# Patient Record
Sex: Female | Born: 1980 | Hispanic: Yes | Marital: Married | Smoking: Never smoker
Health system: Southern US, Community
[De-identification: ages and names within clinical notes are randomized; demographics above are authoritative.]

## PROBLEM LIST (undated history)

## (undated) DIAGNOSIS — K2970 Gastritis, unspecified, without bleeding: Secondary | ICD-10-CM

## (undated) DIAGNOSIS — J45909 Unspecified asthma, uncomplicated: Secondary | ICD-10-CM

## (undated) DIAGNOSIS — G43909 Migraine, unspecified, not intractable, without status migrainosus: Secondary | ICD-10-CM

## (undated) HISTORY — PX: LAPAROSCOPIC GASTRIC SLEEVE RESECTION: SHX5895

## (undated) HISTORY — PX: VAGINAL HYSTERECTOMY: SUR661

## (undated) HISTORY — PX: TUBAL LIGATION: SHX77

## (undated) HISTORY — PX: BREAST SURGERY: SHX581

---

## 2015-03-19 ENCOUNTER — Encounter (HOSPITAL_COMMUNITY): Payer: Self-pay | Admitting: *Deleted

## 2015-03-19 ENCOUNTER — Emergency Department (HOSPITAL_COMMUNITY)
Admission: EM | Admit: 2015-03-19 | Discharge: 2015-03-19 | Disposition: A | Discharge status: Home / Self Care | Payer: Self-pay | Attending: Emergency Medicine | Admitting: Emergency Medicine

## 2015-03-19 ENCOUNTER — Emergency Department (HOSPITAL_COMMUNITY): Payer: Self-pay

## 2015-03-19 DIAGNOSIS — M545 Low back pain: Secondary | ICD-10-CM | POA: Insufficient documentation

## 2015-03-19 DIAGNOSIS — R102 Pelvic and perineal pain: Secondary | ICD-10-CM | POA: Insufficient documentation

## 2015-03-19 DIAGNOSIS — R300 Dysuria: Secondary | ICD-10-CM | POA: Insufficient documentation

## 2015-03-19 DIAGNOSIS — R10819 Abdominal tenderness, unspecified site: Secondary | ICD-10-CM

## 2015-03-19 DIAGNOSIS — R1030 Lower abdominal pain, unspecified: Secondary | ICD-10-CM | POA: Insufficient documentation

## 2015-03-19 DIAGNOSIS — R110 Nausea: Secondary | ICD-10-CM | POA: Insufficient documentation

## 2015-03-19 LAB — URINE MICROSCOPIC-ADD ON

## 2015-03-19 LAB — CBC WITH DIFFERENTIAL/PLATELET
Basophils Absolute: 0 10*3/uL (ref 0.0–0.1)
Basophils Relative: 0 %
EOS PCT: 1 %
Eosinophils Absolute: 0.1 10*3/uL (ref 0.0–0.7)
HEMATOCRIT: 41.7 % (ref 36.0–46.0)
Hemoglobin: 13.5 g/dL (ref 12.0–15.0)
LYMPHS ABS: 0.7 10*3/uL (ref 0.7–4.0)
LYMPHS PCT: 10 %
MCH: 29.9 pg (ref 26.0–34.0)
MCHC: 32.4 g/dL (ref 30.0–36.0)
MCV: 92.5 fL (ref 78.0–100.0)
Monocytes Absolute: 0.3 10*3/uL (ref 0.1–1.0)
Monocytes Relative: 3 %
Neutro Abs: 6.3 10*3/uL (ref 1.7–7.7)
Neutrophils Relative %: 86 %
PLATELETS: 220 10*3/uL (ref 150–400)
RBC: 4.51 MIL/uL (ref 3.87–5.11)
RDW: 13.3 % (ref 11.5–15.5)
WBC: 7.4 10*3/uL (ref 4.0–10.5)

## 2015-03-19 LAB — URINALYSIS, ROUTINE W REFLEX MICROSCOPIC
BILIRUBIN URINE: NEGATIVE
Glucose, UA: NEGATIVE mg/dL
KETONES UR: NEGATIVE mg/dL
Leukocytes, UA: NEGATIVE
NITRITE: NEGATIVE
Protein, ur: NEGATIVE mg/dL
SPECIFIC GRAVITY, URINE: 1.022 (ref 1.005–1.030)
pH: 5.5 (ref 5.0–8.0)

## 2015-03-19 LAB — COMPREHENSIVE METABOLIC PANEL
ALK PHOS: 59 U/L (ref 38–126)
ALT: 19 U/L (ref 14–54)
AST: 17 U/L (ref 15–41)
Albumin: 4.3 g/dL (ref 3.5–5.0)
Anion gap: 9 (ref 5–15)
BILIRUBIN TOTAL: 0.9 mg/dL (ref 0.3–1.2)
BUN: 17 mg/dL (ref 6–20)
CALCIUM: 9.2 mg/dL (ref 8.9–10.3)
CHLORIDE: 105 mmol/L (ref 101–111)
CO2: 24 mmol/L (ref 22–32)
CREATININE: 0.73 mg/dL (ref 0.44–1.00)
Glucose, Bld: 95 mg/dL (ref 65–99)
Potassium: 4.1 mmol/L (ref 3.5–5.1)
Sodium: 138 mmol/L (ref 135–145)
TOTAL PROTEIN: 7.9 g/dL (ref 6.5–8.1)

## 2015-03-19 LAB — I-STAT BETA HCG BLOOD, ED (MC, WL, AP ONLY)

## 2015-03-19 LAB — WET PREP, GENITAL
CLUE CELLS WET PREP: NONE SEEN
Sperm: NONE SEEN
TRICH WET PREP: NONE SEEN
Yeast Wet Prep HPF POC: NONE SEEN

## 2015-03-19 LAB — LIPASE, BLOOD: LIPASE: 23 U/L (ref 11–51)

## 2015-03-19 MED ORDER — KETOROLAC TROMETHAMINE 30 MG/ML IJ SOLN
30.0000 mg | Freq: Once | INTRAMUSCULAR | Status: AC
  Administered 2015-03-19: 30 mg via INTRAVENOUS
  Filled 2015-03-19: qty 1

## 2015-03-19 MED ORDER — KETOROLAC TROMETHAMINE 30 MG/ML IJ SOLN: 30.0000 mg | Freq: Once | INTRAMUSCULAR | Status: DC

## 2015-03-19 MED ORDER — IBUPROFEN 600 MG PO TABS: 600.0000 mg | ORAL_TABLET | Freq: Four times a day (QID) | ORAL | Status: DC | PRN

## 2015-03-19 MED ORDER — ONDANSETRON HCL 4 MG/2ML IJ SOLN
4.0000 mg | Freq: Once | INTRAMUSCULAR | Status: AC
  Administered 2015-03-19: 4 mg via INTRAVENOUS
  Filled 2015-03-19: qty 2

## 2015-03-19 NOTE — ED Notes (Signed)
Bed: WA05 Expected date:  Expected time:  Means of arrival:  Comments: 

## 2015-03-19 NOTE — ED Notes (Signed)
Abdominal pain started this morning. Diarrhea x4, no vomiting. Also c/o back pain

## 2015-03-19 NOTE — ED Notes (Signed)
Pt cannot use restroom at this time, aware urine specimen is needed.  

## 2015-03-19 NOTE — Discharge Instructions (Signed)
We did not find serious cause of your symptoms today. Return without fail for worsening symptoms, including worsening pain, fever, vomiting and unable to keep down food/fluids or any other symptoms concerning to you.  Dolor abdominal en adultos (Abdominal Pain, Adult) El dolor puede tener muchas causas. Normalmente la causa del dolor abdominal no es una enfermedad y Scientist, clinical (histocompatibility and immunogenetics)mejorar sin TEFL teachertratamiento. Frecuentemente puede controlarse y tratarse en casa. Su mdico le Medical sales representativerealizar un examen fsico y posiblemente solicite anlisis de sangre y radiografas para ayudar a Chief Strategy Officerdeterminar la gravedad de su dolor. Sin embargo, en IAC/InterActiveCorpmuchos casos, debe transcurrir ms tiempo antes de que se pueda Clinical research associateencontrar una causa evidente del dolor. Antes de llegar a ese punto, es posible que su mdico no sepa si necesita ms pruebas o un tratamiento ms profundo. INSTRUCCIONES PARA EL CUIDADO EN EL HOGAR  Est atento al dolor para ver si hay cambios. Las siguientes indicaciones ayudarn a Architectural technologistaliviar cualquier molestia que pueda sentir:  [place] solo medicamentos de venta libre o recetados, segn las indicaciones del mdico.  No tome laxantes a menos que se lo haya indicado su mdico.  Pruebe con Neomia Dearuna dieta lquida absoluta (caldo, t o agua) segn se lo indique su mdico. Introduzca gradualmente una dieta normal, segn su tolerancia. SOLICITE ATENCIN MDICA SI:  Tiene dolor abdominal sin explicacin.  Tiene dolor abdominal relacionado con nuseas o diarrea.  Tiene dolor cuando orina o defeca.  Experimenta dolor abdominal que lo despierta de noche.  Tiene dolor abdominal que empeora o mejora cuando come alimentos.  Tiene dolor abdominal que empeora cuando come alimentos grasosos.  Tiene fiebre. SOLICITE ATENCIN MDICA DE INMEDIATO SI:   El dolor no desaparece en un plazo mximo de 2horas.  No deja de (vomitar).  El Engineer, miningdolor se siente solo en partes del abdomen, como el lado derecho o la parte inferior izquierda del  abdomen.  Evaca materia fecal sanguinolenta o negra, de aspecto alquitranado. ASEGRESE DE QUE:  Comprende estas instrucciones.  Controlar su afeccin.  Recibir ayuda de inmediato si no mejora o si empeora.   Esta informacin no tiene Theme park managercomo fin reemplazar el consejo del mdico. Asegrese de hacerle al mdico cualquier pregunta que tenga.   Document Released: 02/27/2005 Document Revised: 03/20/2014 Elsevier Interactive Patient Education [employer]2016 Elsevier Inc.

## 2015-03-19 NOTE — ED Provider Notes (Signed)
CSN: [id number]     Arrival date & time 03/19/15  [id number] History   First MD Initiated Contact with Patient 03/19/15 [phone]     Chief Complaint  Patient presents with  . Abdominal Pain  . Back Pain     (Consider location/radiation/quality/duration/timing/severity/associated sxs/prior Treatment) HPI 35 year old female, otherwise healthy, who presents with low abdominal pain and low back pain for one day. Went to bed well last night feeling well and this morning woke up with low abdominal cramping and low back pain. Pain 6/10 in severity and unremitting. Nausea but no vomiting. States 4 small bowel movements that were soft, but denies diarrhea. No fever or chills. No vaginal bleeding or discharge. Notes possible mild dysuria but no frequency or hematuria. No known sick contacts. No cough, congestion, sore throat, runny nose.    History reviewed. No pertinent past medical history. History reviewed. No pertinent past surgical history. History reviewed. No pertinent family history. Social History  Substance Use Topics  . Smoking status: Never Smoker   . Smokeless tobacco: None  . Alcohol Use: No   OB History    No data available     Review of Systems 10/14 systems reviewed and are negative other than those stated in the HPI   Allergies  Ancef  Home Medications   Prior to Admission medications   Medication Sig Start Date End Date Taking? Authorizing Provider  ibuprofen (ADVIL,MOTRIN) 600 MG tablet Take 1 tablet (600 mg total) by mouth every 6 (six) hours as needed. 03/19/15   Lavera Guiseana Duo Pheng Prokop, MD   BP 121/77 mmHg  Pulse 107  Temp(Src) 98.4 F (36.9 C) (Oral)  Resp 18  SpO2 99%  LMP 02/04/2015 Physical Exam Physical Exam  Nursing note and vitals reviewed. Constitutional: Well developed, well nourished, non-toxic, and in no acute distress Head: Normocephalic and atraumatic.  Mouth/Throat: Oropharynx is clear and moist.  Neck: Normal range of motion. Neck supple.  Cardiovascular:  Normal rate and regular rhythm.   Pulmonary/Chest: Effort normal and breath sounds normal.  Abdominal: Soft. There is suprapubic and bilateral adnexal tenderness. No tenderness at McBurney's point. There is no rebound and no guarding. No CVA tenderness.  Pelvic: Normal external genitalia. Normal internal genitalia. White watery discharge. No blood within the vagina. Minimal cervical motion tenderness. No adnexal masses or tenderness. Musculoskeletal: Normal range of motion. low paraspinal neck tenderness Neurological: Alert, no facial droop, fluent speech, moves all extremities symmetrically Skin: Skin is warm and dry.  Psychiatric: Cooperative  ED Course  Procedures (including critical care time) Labs Review Labs Reviewed  WET PREP, GENITAL - Abnormal; Notable for the following:    WBC, Wet Prep HPF POC MANY (*)    All other components within normal limits  URINALYSIS, ROUTINE W REFLEX MICROSCOPIC (NOT AT [facility]) - Abnormal; Notable for the following:    Hgb urine dipstick TRACE (*)    All other components within normal limits  URINE MICROSCOPIC-ADD ON - Abnormal; Notable for the following:    Squamous Epithelial / LPF 0-5 (*)    Bacteria, UA RARE (*)    All other components within normal limits  CBC WITH DIFFERENTIAL/PLATELET  COMPREHENSIVE METABOLIC PANEL  LIPASE, BLOOD  I-STAT BETA HCG BLOOD, ED ([facility], [facility], [facility] ONLY)  I-STAT BETA HCG BLOOD, ED ([facility], [facility], [facility] ONLY)  GC/CHLAMYDIA PROBE AMP ([place]) NOT AT [facility]    Imaging Review Kore[place] Transvaginal Non-ob  03/19/2015  CLINICAL DATA:  Patient with bilateral pelvic pain. EXAM: TRANSABDOMINAL AND  TRANSVAGINAL ULTRASOUND OF PELVIS DOPPLER ULTRASOUND OF OVARIES TECHNIQUE: Both transabdominal and transvaginal ultrasound examinations of the pelvis were performed. Transabdominal technique was performed for global imaging of the pelvis including uterus, ovaries, adnexal regions, and pelvic cul-de-sac. It was necessary to proceed with endovaginal exam  following the transabdominal exam to visualize the adnexal structures. Color and duplex Doppler ultrasound was utilized to evaluate blood flow to the ovaries. COMPARISON:  None. FINDINGS: Uterus Measurements: 9.8 x 5.6 x 5.2 cm. No fibroids or other mass visualized. Endometrium Thickness: 11 mm. Small amount of fluid within the endometrial canal. Right ovary Measurements: 3.6 x 2.6 x 3.2 cm. There is a 2.0 x 1.5 x 2.4 cm cyst within the right ovary. Left ovary Measurements: 2.7 x 2.0 x 2.7 cm. Normal appearance/no adnexal mass. Pulsed Doppler evaluation of both ovaries demonstrates normal low-resistance arterial and venous waveforms. Other findings No abnormal free fluid. IMPRESSION: No sonographic evidence to suggest ovarian torsion. Electronically Signed   By: Annia Belt M.D.   On: 03/19/2015 13:26   US Pelvis Complete  03/19/2015  CLINICAL DATA:  Patient with bilateral pelvic pain. EXAM: TRANSABDOMINAL AND TRANSVAGINAL ULTRASOUND OF PELVIS DOPPLER ULTRASOUND OF OVARIES TECHNIQUE: Both transabdominal and transvaginal ultrasound examinations of the pelvis were performed. Transabdominal technique was performed for global imaging of the pelvis including uterus, ovaries, adnexal regions, and pelvic cul-de-sac. It was necessary to proceed with endovaginal exam following the transabdominal exam to visualize the adnexal structures. Color and duplex Doppler ultrasound was utilized to evaluate blood flow to the ovaries. COMPARISON:  None. FINDINGS: Uterus Measurements: 9.8 x 5.6 x 5.2 cm. No fibroids or other mass visualized. Endometrium Thickness: 11 mm. Small amount of fluid within the endometrial canal. Right ovary Measurements: 3.6 x 2.6 x 3.2 cm. There is a 2.0 x 1.5 x 2.4 cm cyst within the right ovary. Left ovary Measurements: 2.7 x 2.0 x 2.7 cm. Normal appearance/no adnexal mass. Pulsed Doppler evaluation of both ovaries demonstrates normal low-resistance arterial and venous waveforms. Other findings No abnormal  free fluid. IMPRESSION: No sonographic evidence to suggest ovarian torsion. Electronically Signed   By: Annia Belt M.D.   On: 03/19/2015 13:26   [place] Art/ven Flow Abd Pelv Doppler  03/19/2015  CLINICAL DATA:  Patient with bilateral pelvic pain. EXAM: TRANSABDOMINAL AND TRANSVAGINAL ULTRASOUND OF PELVIS DOPPLER ULTRASOUND OF OVARIES TECHNIQUE: Both transabdominal and transvaginal ultrasound examinations of the pelvis were performed. Transabdominal technique was performed for global imaging of the pelvis including uterus, ovaries, adnexal regions, and pelvic cul-de-sac. It was necessary to proceed with endovaginal exam following the transabdominal exam to visualize the adnexal structures. Color and duplex Doppler ultrasound was utilized to evaluate blood flow to the ovaries. COMPARISON:  None. FINDINGS: Uterus Measurements: 9.8 x 5.6 x 5.2 cm. No fibroids or other mass visualized. Endometrium Thickness: 11 mm. Small amount of fluid within the endometrial canal. Right ovary Measurements: 3.6 x 2.6 x 3.2 cm. There is a 2.0 x 1.5 x 2.4 cm cyst within the right ovary. Left ovary Measurements: 2.7 x 2.0 x 2.7 cm. Normal appearance/no adnexal mass. Pulsed Doppler evaluation of both ovaries demonstrates normal low-resistance arterial and venous waveforms. Other findings No abnormal free fluid. IMPRESSION: No sonographic evidence to suggest ovarian torsion. Electronically Signed   By: Annia Belt M.D.   On: 03/19/2015 13:26   I have personally reviewed and evaluated these images and lab results as part of my medical decision-making.   EKG Interpretation None  MDM   Final diagnoses:  Pelvic pain in female  Suprapubic tenderness   35 year old female, otherwise healthy, who presents with low abdominal pain for one day without associating symptoms. Well appearing, with soft and nonsurgical abdomen. Pain primarily in low pelvis and suprapubic abdomen. No tenderness at McBurney's point to suggest appendicitis or  LLQ tenderness to suggest diverticulitis. No other GI symptoms to suggest sigmoid diverticulitis. Unremarkable CBC, CMP, lipase, pregnancy and UA. Pelvic exam performed with white watery discharge and minimal adnexal tenderness. States she is married and without concern for STD and does need empiric treatment for any STD. Pelvic US without acute pelvic etiology. Symptoms improved with toradol, and at this time do not feel she requires CT imaging. Discussed supportive care, and she will return for any new or worsening symptoms for re-evaluation as needed Strict return and follow-up instructions reviewed. She expressed understanding of all discharge instructions and felt comfortable with the plan of care.     Lavera Guise, MD 03/19/15 [phone]

## 2015-03-22 LAB — GC/CHLAMYDIA PROBE AMP (~~LOC~~) NOT AT ARMC
Chlamydia: NEGATIVE
Neisseria Gonorrhea: NEGATIVE

## 2015-12-25 ENCOUNTER — Emergency Department (HOSPITAL_COMMUNITY): Payer: Self-pay

## 2015-12-25 ENCOUNTER — Emergency Department (HOSPITAL_COMMUNITY)
Admission: EM | Admit: 2015-12-25 | Discharge: 2015-12-25 | Disposition: A | Discharge status: Home / Self Care | Payer: Self-pay | Attending: Emergency Medicine | Admitting: Emergency Medicine

## 2015-12-25 ENCOUNTER — Encounter (HOSPITAL_COMMUNITY): Payer: Self-pay | Admitting: Nurse Practitioner

## 2015-12-25 DIAGNOSIS — Y9289 Other specified places as the place of occurrence of the external cause: Secondary | ICD-10-CM | POA: Insufficient documentation

## 2015-12-25 DIAGNOSIS — Y939 Activity, unspecified: Secondary | ICD-10-CM | POA: Insufficient documentation

## 2015-12-25 DIAGNOSIS — X503XXA Overexertion from repetitive movements, initial encounter: Secondary | ICD-10-CM | POA: Insufficient documentation

## 2015-12-25 DIAGNOSIS — M25532 Pain in left wrist: Secondary | ICD-10-CM | POA: Insufficient documentation

## 2015-12-25 DIAGNOSIS — Y990 Civilian activity done for income or pay: Secondary | ICD-10-CM | POA: Insufficient documentation

## 2015-12-25 MED ORDER — IBUPROFEN 600 MG PO TABS: 600.0000 mg | ORAL_TABLET | Freq: Four times a day (QID) | ORAL | 0 refills | Status: DC | PRN

## 2015-12-25 NOTE — ED Provider Notes (Signed)
[facility]-EMERGENCY DEPT Provider Note   CSN: [id number] Arrival date & time: 12/25/15  1248  By signing my name below, I, Clovis Pu, attest that this documentation has been prepared under the direction and in the presence of  Melburn Hake, PA-C. Electronically Signed: Clovis Pu, ED Scribe. 12/25/15. 1:45 PM.  History   Chief Complaint Chief Complaint  Patient presents with  . Wrist Pain    The history is provided by the patient. No language interpreter was used.   HPI Comments:  Pamela Macias is a 35 y.o. female who presents to the Emergency Department complaining of sudden onset L wrist pain x 1 day. Pt states the pain is exacerbated with movement. She notes associated swelling to her L forearm. 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 She has taken acetaminophen and applied ice with little relief. Pt denies any falls, recent injury, fevers, redness, warmth, numbness, tingling, weakness. She is R hand dominant. Pt denies any other complaints at this time.   History reviewed. No pertinent past medical history.  There are no active problems to display for this patient.   History reviewed. No pertinent surgical history.  OB History    No data available       Home Medications    Prior to Admission medications   Medication Sig Start Date End Date Taking? Authorizing Provider  ibuprofen (ADVIL,MOTRIN) 600 MG tablet Take 1 tablet (600 mg total) by mouth every 6 (six) hours as needed. 12/25/15   Barrett Henle, PA-C    Family History History reviewed. No pertinent family history.  Social History Social History  Substance Use Topics  . Smoking status: Never Smoker  . Smokeless tobacco: Never Used  . Alcohol use No     Allergies   Ancef [cefazolin]   Review of Systems Review of Systems  Constitutional: Negative for fever.  Musculoskeletal: Positive for arthralgias and joint swelling.  Skin:  Negative for color change.     Physical Exam Updated Vital Signs BP 116/73 (BP Location: Left Arm)   Pulse 84   Temp 98.1 F (36.7 C) (Oral)   Resp 16   LMP 12/11/2015 (Within Days)   SpO2 99%   Physical Exam  Constitutional: She is oriented to person, place, and time. She appears well-developed and well-nourished. No distress.  HENT:  Head: Normocephalic and atraumatic.  Eyes: Conjunctivae and EOM are normal. Right eye exhibits no discharge. Left eye exhibits no discharge. No scleral icterus.  Neck: Normal range of motion. Neck supple.  Cardiovascular: Normal rate and intact distal pulses.   Pulmonary/Chest: Effort normal.  Abdominal: She exhibits no distension.  Musculoskeletal: Normal range of motion. She exhibits edema and tenderness.  Moderate swelling noted to dorsal distal forearm w/o tenderness. TTP over L medial wrist, L 4th and 5th metacarpals, and L lateral epicondyle at elbow. FROM of L shoulder, elbow, forearm, wrist and hand.  Equal grip strength bilaterally. 2+ radial pulse. Capillary refill < 2. No erythema, warmth, induarion or fluctuance.  Neurological: She is alert and oriented to person, place, and time.  Skin: Skin is dry. Capillary refill takes less than 2 seconds. No erythema.  Psychiatric: She has a normal mood and affect.  Nursing note and vitals reviewed.    ED Treatments / Results  DIAGNOSTIC STUDIES:  Oxygen Saturation is 100% on RA, normal by my interpretation.    COORDINATION OF CARE:  1:39 PM Discussed treatment plan with  pt at bedside and pt agreed to plan.  Labs (all labs ordered are listed, but only abnormal results are displayed) Labs Reviewed - No data to display  EKG  EKG Interpretation None       Radiology Dg Forearm Left  Result Date: 12/25/2015 CLINICAL DATA:  Distal left ulnar pain and swelling which extends into the wrist and metacarpals for the past 2 days. No known injury EXAM: LEFT FOREARM - 2 VIEW COMPARISON:  Left  hand radiographs - earlier same day FINDINGS: No fracture or dislocation. Wrist and elbow joints appear preserved given obliquity and large field of view. No definite elbow joint effusion. No definite displacement of the pronator quadratus fat pad. Regional soft tissues appear normal. No radiopaque foreign body. IMPRESSION: No explanation for patient's distal forearm, wrist and hand pain. Electronically Signed   By: Simonne ComeJohn  Watts M.D.   On: 12/25/2015 14:36   Dg Hand Complete Left  Result Date: 12/25/2015 CLINICAL DATA:  Distal left ulnar pain and swelling which extends into the wrist and metacarpals for the past 2 days. No known injury. EXAM: LEFT HAND - COMPLETE 3+ VIEW COMPARISON:  Left forearm radiographs -earlier same day FINDINGS: No fracture or dislocation. Joint spaces are preserved. No erosions. No evidence of chondrocalcinosis. Regional soft tissues appear normal. No radiopaque foreign body. IMPRESSION: No explanation for patient's left forearm, wrist and hand pain. Electronically Signed   By: Simonne ComeJohn  Watts M.D.   On: 12/25/2015 14:29    Procedures Procedures (including critical care time)  Medications Ordered in ED Medications - No data to display   Initial Impression / Assessment and Plan / ED Course  I have reviewed the triage vital signs and the nursing notes.  Pertinent labs & imaging results that were available during my care of the patient were reviewed by me and considered in my medical decision making (see chart for details).  Clinical Course    Patient presents with left wrist pain and associated swelling that started yesterday while she was at work. 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 Denies any recent fall or injury. VSS. Exam revealed mild tenderness over left wrist with mild swelling and mild tenderness over left lateral epicondyle. Left arm neurovascularly intact. No bony abnormality or deformity, no erythema or excessive heat, no evidence of cellulitis, or  septic joint. Do not suspect gout.  Patient X-Ray negative for obvious fracture or dislocation.  Ace wrap applied to patient's wrist in the ED. Discussed results and plan for discharge. Pt advised to follow up with orthopedics if her symptoms do not improve over the next week. Plan to discharge patient home with NSAIDs, conservative. Patient will be discharged home & is agreeable with above plan. Discussed return precautions.   Final Clinical Impressions(s) / ED Diagnoses   Final diagnoses:  Left wrist pain    New Prescriptions New Prescriptions   IBUPROFEN (ADVIL,MOTRIN) 600 MG TABLET    Take 1 tablet (600 mg total) by mouth every 6 (six) hours as needed.   I personally performed the services described in this documentation, which was scribed in my presence. The recorded information has been reviewed and is accurate.     Satira Sarkicole Elizabeth CobbtownNadeau, [place] JerseyPA-C 12/25/15 1536    Glynn OctaveStephen Rancour, MD 12/25/15 [phone]

## 2015-12-25 NOTE — ED Triage Notes (Signed)
Pt presents with chief complaint of left wrist pain. She reports onset of pain after working at [employer] last night. She denies any injuries. she tried ice and acetaminophen with temporary pain relief. Pain was more severe this morning and radiating into L elbow.

## 2015-12-25 NOTE — Discharge Instructions (Signed)
Take your medication as prescribed to help with pain and swelling. I also recommend resting, elevating and applying ice to her wrist for 15 minutes 3-4 times daily to help alleviate pain and swelling. He may continue to use the Ace wrap as needed for extra support and comfort. I recommend following up with the orthopedic clinic within the next week if her symptoms have not improved. Please return to the Emergency Department if symptoms worsen or new onset of fever, redness, worsening swelling or pain, warmth, numbness, tingling, weakness.

## 2015-12-25 NOTE — ED Notes (Signed)
Declined W/C at D/C and was escorted to lobby by RN. 

## 2016-03-15 ENCOUNTER — Emergency Department (HOSPITAL_COMMUNITY)
Admission: EM | Admit: 2016-03-15 | Discharge: 2016-03-16 | Disposition: A | Discharge status: Home / Self Care | Payer: 59 | Attending: Emergency Medicine | Admitting: Emergency Medicine

## 2016-03-15 ENCOUNTER — Encounter (HOSPITAL_COMMUNITY): Payer: Self-pay | Admitting: Emergency Medicine

## 2016-03-15 DIAGNOSIS — G43809 Other migraine, not intractable, without status migrainosus: Secondary | ICD-10-CM | POA: Insufficient documentation

## 2016-03-15 DIAGNOSIS — Z79899 Other long term (current) drug therapy: Secondary | ICD-10-CM | POA: Insufficient documentation

## 2016-03-15 DIAGNOSIS — R51 Headache: Secondary | ICD-10-CM | POA: Diagnosis present

## 2016-03-15 MED ORDER — ONDANSETRON HCL 4 MG/2ML IJ SOLN
4.0000 mg | Freq: Once | INTRAMUSCULAR | Status: AC
  Administered 2016-03-15: 4 mg via INTRAVENOUS
  Filled 2016-03-15: qty 2

## 2016-03-15 MED ORDER — SODIUM CHLORIDE 0.9 % IV BOLUS (SEPSIS)
1000.0000 mL | Freq: Once | INTRAVENOUS | Status: AC
  Administered 2016-03-15: 1000 mL via INTRAVENOUS

## 2016-03-15 MED ORDER — MORPHINE SULFATE (PF) 4 MG/ML IV SOLN
4.0000 mg | Freq: Once | INTRAVENOUS | Status: AC
  Administered 2016-03-15: 4 mg via INTRAVENOUS
  Filled 2016-03-15: qty 1

## 2016-03-15 MED ORDER — KETOROLAC TROMETHAMINE 30 MG/ML IJ SOLN
30.0000 mg | Freq: Once | INTRAMUSCULAR | Status: AC
  Administered 2016-03-15: 30 mg via INTRAVENOUS
  Filled 2016-03-15: qty 1

## 2016-03-15 MED ORDER — PROCHLORPERAZINE EDISYLATE 5 MG/ML IJ SOLN
10.0000 mg | Freq: Four times a day (QID) | INTRAMUSCULAR | Status: DC | PRN
  Administered 2016-03-15: 10 mg via INTRAVENOUS
  Filled 2016-03-15: qty 2

## 2016-03-15 NOTE — ED Triage Notes (Addendum)
Patient reports headache with N/V/D and light sensitivity x3 hours. Hx migraine. Denies abdominal pain, chest pain, amd SOB. Reports taking Fiorcet and Phenergan with no relief.

## 2016-03-16 NOTE — ED Provider Notes (Signed)
[facility]-EMERGENCY DEPT Provider Note   CSN: [id number] Arrival date & time: 03/15/16  2036     History   Chief Complaint Chief Complaint  Patient presents with  . Migraine  . Nausea  . Emesis    HPI Pamela Macias is a 36 y.o. female.  HPI Patient presents emergency department nausea vomiting diarrhea as well as severe headache with light sensitivity over the past several hours.  She has a history of migraines.  She tried her nausea medicine at home without improvement in her symptoms.  She denies any fever or chills.  No neck pain.  No abdominal pain.  Her pain is moderate in severity.  No recent sick contacts   History reviewed. No pertinent past medical history.  There are no active problems to display for this patient.   History reviewed. No pertinent surgical history.  OB History    No data available       Home Medications    Prior to Admission medications   Medication Sig Start Date End Date Taking? Authorizing Provider  butalbital-acetaminophen-caffeine (FIORICET WITH CODEINE) 50-325-40-30 MG capsule Take 1 capsule by mouth every 4 (four) hours as needed for headache.   Yes Historical Provider, MD  promethazine (PHENERGAN) 25 MG tablet Take 25 mg by mouth every 6 (six) hours as needed for nausea or vomiting.   Yes Historical Provider, MD  ibuprofen (ADVIL,MOTRIN) 600 MG tablet Take 1 tablet (600 mg total) by mouth every 6 (six) hours as needed. Patient not taking: Reported on 03/15/2016 12/25/15   Barrett Henle, PA-C    Family History History reviewed. No pertinent family history.  Social History Social History  Substance Use Topics  . Smoking status: Never Smoker  . Smokeless tobacco: Never Used  . Alcohol use No     Allergies   Ancef [cefazolin]   Review of Systems Review of Systems  All other systems reviewed and are negative.    Physical Exam Updated Vital Signs BP 92/60 (BP Location: Left Arm)   Pulse 73   Temp 98.2 F  (36.8 C) (Oral)   Resp 18   Ht 5\' 2"  (1.575 m)   Wt 197 lb 8 oz (89.6 kg)   LMP 02/17/2016   SpO2 100%   BMI 36.12 kg/m   Physical Exam  Constitutional: She is oriented to person, place, and time. She appears well-developed and well-nourished. No distress.  HENT:  Head: Normocephalic and atraumatic.  Eyes: EOM are normal. Pupils are equal, round, and reactive to light.  Neck: Normal range of motion.  Cardiovascular: Normal rate, regular rhythm and normal heart sounds.   Pulmonary/Chest: Effort normal and breath sounds normal.  Abdominal: Soft. She exhibits no distension. There is no tenderness.  Musculoskeletal: Normal range of motion.  Neurological: She is alert and oriented to person, place, and time.  5/5 strength in major muscle groups of  bilateral upper and lower extremities. Speech normal. No facial asymetry.   Skin: Skin is warm and dry.  Psychiatric: She has a normal mood and affect. Judgment normal.  Nursing note and vitals reviewed.    ED Treatments / Results  Labs (all labs ordered are listed, but only abnormal results are displayed) Labs Reviewed - No data to display  EKG  EKG Interpretation None       Radiology No results found.  Procedures Procedures (including critical care time)  Medications Ordered in ED Medications  prochlorperazine (COMPAZINE) injection 10 mg (10 mg Intravenous Given 03/15/16 2354)  sodium  chloride 0.9 % bolus 1,000 mL (0 mLs Intravenous Stopped 03/16/16 0033)  ondansetron (ZOFRAN) injection 4 mg (4 mg Intravenous Given 03/15/16 2353)  ketorolac (TORADOL) 30 MG/ML injection 30 mg (30 mg Intravenous Given 03/15/16 2353)  morphine 4 MG/ML injection 4 mg (4 mg Intravenous Given 03/15/16 2354)     Initial Impression / Assessment and Plan / ED Course  I have reviewed the triage vital signs and the nursing notes.  Pertinent labs & imaging results that were available during my care of the patient were reviewed by me and considered in my  medical decision making (see chart for details).  Clinical Course     12:45 AM Patient feels much better this time.  Discharge home in good condition.  She has nausea medication at home.  Final Clinical Impressions(s) / ED Diagnoses   Final diagnoses:  Other migraine without status migrainosus, not intractable    New Prescriptions New Prescriptions   No medications on file     Azalia BilisKevin Roan Sawchuk, MD 03/16/16 [phone]

## 2016-05-12 ENCOUNTER — Encounter (HOSPITAL_COMMUNITY): Payer: Self-pay | Admitting: *Deleted

## 2016-05-12 ENCOUNTER — Emergency Department (HOSPITAL_COMMUNITY)
Admission: EM | Admit: 2016-05-12 | Discharge: 2016-05-12 | Disposition: A | Discharge status: Home / Self Care | Payer: 59 | Attending: Emergency Medicine | Admitting: Emergency Medicine

## 2016-05-12 ENCOUNTER — Emergency Department (HOSPITAL_COMMUNITY): Payer: 59

## 2016-05-12 DIAGNOSIS — J45909 Unspecified asthma, uncomplicated: Secondary | ICD-10-CM | POA: Insufficient documentation

## 2016-05-12 DIAGNOSIS — J069 Acute upper respiratory infection, unspecified: Secondary | ICD-10-CM | POA: Diagnosis not present

## 2016-05-12 DIAGNOSIS — R52 Pain, unspecified: Secondary | ICD-10-CM | POA: Diagnosis present

## 2016-05-12 DIAGNOSIS — Z79899 Other long term (current) drug therapy: Secondary | ICD-10-CM | POA: Diagnosis not present

## 2016-05-12 HISTORY — DX: Unspecified asthma, uncomplicated: J45.909

## 2016-05-12 LAB — RAPID STREP SCREEN (MED CTR MEBANE ONLY): Streptococcus, Group A Screen (Direct): NEGATIVE

## 2016-05-12 MED ORDER — PREDNISONE 10 MG (21) PO TBPK: ORAL_TABLET | ORAL | 0 refills | Status: DC

## 2016-05-12 MED ORDER — BENZONATATE 100 MG PO CAPS: 100.0000 mg | ORAL_CAPSULE | Freq: Three times a day (TID) | ORAL | 0 refills | Status: DC

## 2016-05-12 MED ORDER — IBUPROFEN 800 MG PO TABS: 800.0000 mg | ORAL_TABLET | Freq: Three times a day (TID) | ORAL | 0 refills | Status: DC

## 2016-05-12 NOTE — ED Provider Notes (Signed)
[facility]-EMERGENCY DEPT Provider Note   CSN: [id number] Arrival date & time: 05/12/16  4098  By signing my name below, I, Majel Homer, attest that this documentation has been prepared under the direction and in the presence of Zacharie Portner, PA-C . Electronically Signed: Majel Homer, Scribe. 05/12/2016. 9:39 AM.  History   Chief Complaint Chief Complaint  Patient presents with  . Generalized Body Aches   The history is provided by the patient. No language interpreter was used.   HPI Comments: Pamela Macias is a 36 y.o. female with PMHx of asthma, who presents to the Emergency Department complaining of persistent, sore throat that began ~4 days ago. Pt reports associated chills and cough productive of "green" mucous. She states she visited Urgent Care for similar symptoms on 2/26 and was told she had a "viral infection." She notes she has not used any medication to relieve her symptoms but has used her albuterol inhaler 2 times this morning and once last night with mild relief of her cough. Patient denies known fever, N/V/D, shortness of breath, chest pain, or any other complaints.  Patient was offered a [place] language interpreter, but declined.  Past Medical History:  Diagnosis Date  . Asthma    There are no active problems to display for this patient.  History reviewed. No pertinent surgical history.  OB History    No data available     Home Medications    Prior to Admission medications   Medication Sig Start Date End Date Taking? Authorizing Provider  benzonatate (TESSALON) 100 MG capsule Take 1 capsule (100 mg total) by mouth every 8 (eight) hours. 05/12/16   Mary-Anne Polizzi C Maxmillian Carsey, PA-C  butalbital-acetaminophen-caffeine (FIORICET WITH CODEINE) 50-325-40-30 MG capsule Take 1 capsule by mouth every 4 (four) hours as needed for headache.    Historical Provider, MD  ibuprofen (ADVIL,MOTRIN) 800 MG tablet Take 1 tablet (800 mg total) by mouth 3 (three) times daily. 05/12/16   Kyrillos Adams C Jaquila Santelli, PA-C    predniSONE (STERAPRED UNI-PAK 21 TAB) 10 MG (21) TBPK tablet Take 6 tabs day 1, 5 tabs day 2, 4 tabs day 3, 3 tabs day 4, 2 tabs day 5, and 1 tab on day 6. 05/12/16   Lasean Gorniak C Andras Grunewald, PA-C  promethazine (PHENERGAN) 25 MG tablet Take 25 mg by mouth every 6 (six) hours as needed for nausea or vomiting.    Historical Provider, MD    Family History No family history on file.  Social History Social History  Substance Use Topics  . Smoking status: Never Smoker  . Smokeless tobacco: Never Used  . Alcohol use No   Allergies   Ancef [cefazolin]  Review of Systems Review of Systems  Constitutional: Negative for fever.  HENT: Positive for sore throat. Negative for trouble swallowing and voice change.   Respiratory: Positive for cough. Negative for shortness of breath.   Cardiovascular: Negative for chest pain.  Gastrointestinal: Negative for abdominal pain, diarrhea, nausea and vomiting.  All other systems reviewed and are negative.  Physical Exam Updated Vital Signs BP 122/89 (BP Location: Left Arm)   Pulse 81   Temp 98.4 F (36.9 C) (Oral)   SpO2 100%   Physical Exam  Constitutional: She appears well-developed and well-nourished. No distress.  HENT:  Head: Normocephalic and atraumatic.  Right Ear: Tympanic membrane, external ear and ear canal normal.  Left Ear: Tympanic membrane, external ear and ear canal normal.  Mouth/Throat: Uvula is midline and mucous membranes are normal. Posterior oropharyngeal erythema present.  No oropharyngeal exudate or posterior oropharyngeal edema.  Eyes: Conjunctivae are normal.  Neck: Neck supple.  Cardiovascular: Normal rate, regular rhythm, normal heart sounds and intact distal pulses.   Pulmonary/Chest: Effort normal and breath sounds normal. No respiratory distress.  Abdominal: Soft. There is no tenderness. There is no guarding.  Musculoskeletal: She exhibits no edema.  Lymphadenopathy:    She has no cervical adenopathy.  Neurological: She is  alert.  Skin: Skin is warm and dry. She is not diaphoretic.  Psychiatric: She has a normal mood and affect. Her behavior is normal.  Nursing note and vitals reviewed.  ED Treatments / Results  DIAGNOSTIC STUDIES:  Oxygen Saturation is 100% on RA, normal by my interpretation.    COORDINATION OF CARE:  9:36 AM Discussed treatment plan with pt at bedside and pt agreed to plan.  Labs (all labs ordered are listed, but only abnormal results are displayed) Labs Reviewed  RAPID STREP SCREEN (NOT [facility])  CULTURE, GROUP A STREP [facility])    EKG  EKG Interpretation None       Radiology Dg Chest 2 View  Result Date: 05/12/2016 CLINICAL DATA:  36 year old female with productive cough for 5 days. Initial encounter. EXAM: CHEST  2 VIEW COMPARISON:  None. FINDINGS: Low normal lung volumes. Normal cardiac size and mediastinal contours. Visualized tracheal air column is within normal limits. No pneumothorax, pulmonary edema, pleural effusion or confluent pulmonary opacity. No convincing abnormal interstitial opacity. No acute osseous abnormality identified. Negative visible bowel gas pattern. IMPRESSION: Negative.  No acute cardiopulmonary abnormality. Electronically Signed   By: Odessa FlemingH  Hall M.D.   On: 05/12/2016 10:09    Procedures Procedures (including critical care time)  Medications Ordered in ED Medications - No data to display  Initial Impression / Assessment and Plan / ED Course  I have reviewed the triage vital signs and the nursing notes.  Pertinent labs & imaging results that were available during my care of the patient were reviewed by me and considered in my medical decision making (see chart for details).     Patient presents with URI-type symptoms. She is nontoxic appearing and has no signs of sepsis. Chest x-ray with no acute changes. Negative rapid strep. Home care and return precautions discussed. Patient voices understanding of all instructions and is comfortable  discharge.   Vitals:   05/12/16 0849 05/12/16 1029  BP: 122/89 114/70  Pulse: 81 75  Resp:  16  Temp: 98.4 F (36.9 C) 98.6 F (37 C)  TempSrc: Oral   SpO2: 100% 99%     Final Clinical Impressions(s) / ED Diagnoses   Final diagnoses:  Upper respiratory tract infection, unspecified type    New Prescriptions Discharge Medication List as of 05/12/2016 10:15 AM    START taking these medications   Details  benzonatate (TESSALON) 100 MG capsule Take 1 capsule (100 mg total) by mouth every 8 (eight) hours., Starting Fri 05/12/2016, Print         Anselm PancoastShawn C Dania Marsan, PA-C 05/14/16 0820    Shaune Pollackameron Isaacs, MD 05/14/16 [phone]

## 2016-05-12 NOTE — ED Triage Notes (Addendum)
[facility] has a [facility]. Given unknown meds but not feeling better. No fevers that pt knows of but does report chills. Continues to complain of sore throat and nasal drainage

## 2016-05-12 NOTE — Discharge Instructions (Signed)
Your strep test was negative. Your chest xray showed no signs of pneumonia or other abnormality. Your symptoms are consistent with a viral illness. Viruses do not require antibiotics. Treatment is symptomatic care and it is important to note that these symptoms may last for 7-14 days.   Hydration: Symptoms will be intensified and complicated by dehydration. Dehydration can also extend the duration of symptoms. Drink plenty of fluids and get plenty of rest. You should be drinking at least half a liter of water an hour to stay hydrated. Electrolyte drinks are also encouraged. You should be drinking enough fluids to make your urine light yellow, almost clear. If this is not the case, you are not drinking enough water. Please note that some of the treatments indicated below will not be effective if you are not adequately hydrated. Pain or fever: Ibuprofen, Naproxen, or Tylenol for pain or fever.  Cough: Use the Tessalon for cough.  Congestion: Plain Mucinex may help relieve congestion. Saline sinus rinses and saline nasal sprays may also help relieve congestion.  Sore throat: Warm liquids or Chloraseptic spray may help soothe a sore throat. Gargle twice a day with a salt water solution made from a half teaspoon of salt in a cup of warm water.  Follow up: Follow up with a primary care provider, as needed, for any future management of this issue.

## 2016-05-14 LAB — CULTURE, GROUP A STREP (THRC)

## 2016-09-22 ENCOUNTER — Encounter (HOSPITAL_COMMUNITY): Payer: Self-pay | Admitting: Emergency Medicine

## 2016-09-22 ENCOUNTER — Emergency Department (HOSPITAL_COMMUNITY)
Admission: EM | Admit: 2016-09-22 | Discharge: 2016-09-22 | Disposition: A | Discharge status: Home / Self Care | Payer: 59 | Attending: Emergency Medicine | Admitting: Emergency Medicine

## 2016-09-22 DIAGNOSIS — J45909 Unspecified asthma, uncomplicated: Secondary | ICD-10-CM | POA: Diagnosis not present

## 2016-09-22 DIAGNOSIS — R197 Diarrhea, unspecified: Secondary | ICD-10-CM | POA: Diagnosis not present

## 2016-09-22 DIAGNOSIS — R1013 Epigastric pain: Secondary | ICD-10-CM | POA: Diagnosis not present

## 2016-09-22 DIAGNOSIS — Z79899 Other long term (current) drug therapy: Secondary | ICD-10-CM | POA: Insufficient documentation

## 2016-09-22 LAB — COMPREHENSIVE METABOLIC PANEL
ALBUMIN: 4 g/dL (ref 3.5–5.0)
ALT: 46 U/L (ref 14–54)
ANION GAP: 8 (ref 5–15)
AST: 34 U/L (ref 15–41)
Alkaline Phosphatase: 51 U/L (ref 38–126)
BILIRUBIN TOTAL: 0.5 mg/dL (ref 0.3–1.2)
BUN: 14 mg/dL (ref 6–20)
CHLORIDE: 105 mmol/L (ref 101–111)
CO2: 25 mmol/L (ref 22–32)
Calcium: 9 mg/dL (ref 8.9–10.3)
Creatinine, Ser: 0.61 mg/dL (ref 0.44–1.00)
GFR calc Af Amer: >60 mL/min (ref >60)
GFR calc non Af Amer: >60 mL/min (ref >60)
GLUCOSE: 105 mg/dL — AB (ref 65–99)
POTASSIUM: 4 mmol/L (ref 3.5–5.1)
SODIUM: 138 mmol/L (ref 135–145)
TOTAL PROTEIN: 7.5 g/dL (ref 6.5–8.1)

## 2016-09-22 LAB — URINALYSIS, ROUTINE W REFLEX MICROSCOPIC
Bilirubin Urine: NEGATIVE
Glucose, UA: NEGATIVE mg/dL
Ketones, ur: NEGATIVE mg/dL
Leukocytes, UA: NEGATIVE
NITRITE: NEGATIVE
PROTEIN: NEGATIVE mg/dL
SPECIFIC GRAVITY, URINE: 1.025 (ref 1.005–1.030)
pH: 6 (ref 5.0–8.0)

## 2016-09-22 LAB — CBC
HEMATOCRIT: 39.5 % (ref 36.0–46.0)
HEMOGLOBIN: 13.7 g/dL (ref 12.0–15.0)
MCH: 30.7 pg (ref 26.0–34.0)
MCHC: 34.7 g/dL (ref 30.0–36.0)
MCV: 88.6 fL (ref 78.0–100.0)
Platelets: 208 10*3/uL (ref 150–400)
RBC: 4.46 MIL/uL (ref 3.87–5.11)
RDW: 12.4 % (ref 11.5–15.5)
WBC: 8 10*3/uL (ref 4.0–10.5)

## 2016-09-22 LAB — I-STAT BETA HCG BLOOD, ED (MC, WL, AP ONLY)

## 2016-09-22 LAB — LIPASE, BLOOD: Lipase: 21 U/L (ref 11–51)

## 2016-09-22 MED ORDER — OMEPRAZOLE 20 MG PO CPDR: 20.0000 mg | DELAYED_RELEASE_CAPSULE | Freq: Every day | ORAL | 0 refills | Status: AC

## 2016-09-22 MED ORDER — GI COCKTAIL ~~LOC~~
30.0000 mL | Freq: Once | ORAL | Status: AC
  Administered 2016-09-22: 30 mL via ORAL
  Filled 2016-09-22: qty 30

## 2016-09-22 MED ORDER — SUCRALFATE 1 GM/10ML PO SUSP: 1.0000 g | Freq: Three times a day (TID) | ORAL | 0 refills | Status: DC

## 2016-09-22 NOTE — ED Provider Notes (Signed)
[facility]-EMERGENCY DEPT Provider Note   CSN: [id number] Arrival date & time: 09/22/16  0023     History   Chief Complaint Chief Complaint  Patient presents with  . Abdominal Pain    HPI Pamela Macias is a 36 y.o. female.  Patient presents to the emergency department with chief complaint of epigastric abdominal pain. She states that she had the symptoms 2 days ago after eating pizza. She states that she took some Zantac, and her symptoms improved. She states that the pain worsened this evening at around 11:45 PM. She states that this feels similar to when she had a prior stomach ulcer. She denies any associated fevers, chills, nausea, vomiting. She does state that she had a few episodes diarrhea. She denies any lower abdominal tenderness. There are no other associated symptoms or modifying factors.   The history is provided by the patient. No language interpreter was used.    Past Medical History:  Diagnosis Date  . Asthma     There are no active problems to display for this patient.   History reviewed. No pertinent surgical history.  OB History    No data available       Home Medications    Prior to Admission medications   Medication Sig Start Date End Date Taking? Authorizing Provider  ranitidine (ZANTAC) 75 MG tablet Take 75 mg by mouth 2 (two) times daily.   Yes [provider]  benzonatate (TESSALON) 100 MG capsule Take 1 capsule (100 mg total) by mouth every 8 (eight) hours. Patient not taking: Reported on 09/22/2016 05/12/16   Anselm PancoastJoy, Shawn C, PA-C  ibuprofen (ADVIL,MOTRIN) 800 MG tablet Take 1 tablet (800 mg total) by mouth 3 (three) times daily. Patient not taking: Reported on 09/22/2016 05/12/16   Joy, Hillard DankerShawn C, PA-C  predniSONE (STERAPRED UNI-PAK 21 TAB) 10 MG (21) TBPK tablet Take 6 tabs day 1, 5 tabs day 2, 4 tabs day 3, 3 tabs day 4, 2 tabs day 5, and 1 tab on day 6. Patient not taking: Reported on 09/22/2016 05/12/16   Anselm PancoastJoy, Shawn C, PA-C    Family  History History reviewed. No pertinent family history.  Social History Social History  Substance Use Topics  . Smoking status: Never Smoker  . Smokeless tobacco: Never Used  . Alcohol use No     Allergies   Ancef [cefazolin]   Review of Systems Review of Systems  All other systems reviewed and are negative.    Physical Exam Updated Vital Signs BP 120/77 (BP Location: Left Arm)   Pulse 81   Temp 98.2 F (36.8 C) (Oral)   Resp 18   Ht 5\' 2"  (1.575 m)   Wt 89.8 kg (198 lb)   LMP 09/02/2016   SpO2 100%   BMI 36.21 kg/m   Physical Exam  Constitutional: She is oriented to person, place, and time. She appears well-developed and well-nourished.  HENT:  Head: Normocephalic and atraumatic.  Eyes: Pupils are equal, round, and reactive to light. Conjunctivae and EOM are normal.  Neck: Normal range of motion. Neck supple.  Cardiovascular: Normal rate and regular rhythm.  Exam reveals no gallop and no friction rub.   No murmur heard. Pulmonary/Chest: Effort normal and breath sounds normal. No respiratory distress. She has no wheezes. She has no rales. She exhibits no tenderness.  Abdominal: Soft. Bowel sounds are normal. She exhibits no distension and no mass. There is no tenderness. There is no rebound and no guarding.  Mild epigastric tenderness,  no Murphy's sign, no McBurney point tenderness, no lower abdominal tenderness  Musculoskeletal: Normal range of motion. She exhibits no edema or tenderness.  Neurological: She is alert and oriented to person, place, and time.  Skin: Skin is warm and dry.  Psychiatric: She has a normal mood and affect. Her behavior is normal. Judgment and thought content normal.  Nursing note and vitals reviewed.    ED Treatments / Results  Labs (all labs ordered are listed, but only abnormal results are displayed) Labs Reviewed  CBC  LIPASE, BLOOD  COMPREHENSIVE METABOLIC PANEL  URINALYSIS, ROUTINE W REFLEX MICROSCOPIC  I-STAT BETA HCG  BLOOD, ED ([facility], [facility], [facility] ONLY)    EKG  EKG Interpretation None       Radiology No results found.  Procedures Procedures (including critical care time)  Medications Ordered in ED Medications  gi cocktail (Maalox,Lidocaine,Donnatal) (not administered)     Initial Impression / Assessment and Plan / ED Course  I have reviewed the triage vital signs and the nursing notes.  Pertinent labs & imaging results that were available during my care of the patient were reviewed by me and considered in my medical decision making (see chart for details).     Patient with epigastric pain. Worsened after eating. Feels similar to prior stomach ulcer. Will treat with GI cocktail. Laboratory workup is pending. Anticipate discharge with Carafate and PPI.  5:21 AM Patient is significantly improved.  Labs are reassuring.  Will discharge per plan above.  Patient is stable and ready for discharge.  Final Clinical Impressions(s) / ED Diagnoses   Final diagnoses:  Epigastric abdominal pain    New Prescriptions New Prescriptions   OMEPRAZOLE (PRILOSEC) 20 MG CAPSULE    Take 1 capsule (20 mg total) by mouth daily.   SUCRALFATE (CARAFATE) 1 GM/10ML SUSPENSION    Take 10 mLs (1 g total) by mouth 4 (four) times daily -  with meals and at bedtime.     Roxy Horseman, PA-C 09/22/16 0525    Dione Booze, MD 09/22/16 [phone]

## 2016-09-22 NOTE — ED Triage Notes (Signed)
Patient complaining of abdominal pain and pelvic pain. Patient states this started about 2345 on September 21, 2016. Patient states that she is starting to have diarrhea.

## 2017-07-20 ENCOUNTER — Ambulatory Visit (INDEPENDENT_AMBULATORY_CARE_PROVIDER_SITE_OTHER): Payer: Self-pay | Admitting: *Deleted

## 2017-07-20 DIAGNOSIS — N912 Amenorrhea, unspecified: Secondary | ICD-10-CM

## 2017-07-20 DIAGNOSIS — Z3202 Encounter for pregnancy test, result negative: Secondary | ICD-10-CM

## 2017-07-20 LAB — POCT PREGNANCY, URINE: Preg Test, Ur: NEGATIVE

## 2017-07-20 NOTE — Progress Notes (Signed)
Interpreter [employer] present for encounter. Pt informed of negative UPT today. She reports she [place] LMP was during first week of March. She has taken 4 pregnancy tests @ home- 2 were negative and 2 were positive. Pt denies all sx of pregnancy except for amenorrhea. Sx of miscarriage discussed and pt advised of reasons to return to hospital. 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 [phone]). Pt advised to schedule appointment for evaluation of no menses by June. Pt voiced understanding.

## 2017-07-23 NOTE — Progress Notes (Signed)
Chart reviewed for nurse visit. Agree with plan of care.   Sharyon Cable, CNM 07/23/2017 8:10 AM

## 2017-08-12 IMAGING — US US TRANSVAGINAL NON-OB
1 series · 13 of 25 positions shown · non-contrast
Comparison: None.

CLINICAL DATA: Patient with bilateral pelvic pain.

EXAM:
TRANSABDOMINAL AND TRANSVAGINAL ULTRASOUND OF PELVIS
DOPPLER ULTRASOUND OF OVARIES
TECHNIQUE: Both transabdominal and transvaginal ultrasound examinations of the
pelvis were performed. Transabdominal technique was performed for
global imaging of the pelvis including uterus, ovaries, adnexal
regions, and pelvic cul-de-sac.
It was necessary to proceed with endovaginal exam following the
transabdominal exam to visualize the adnexal structures. Color and
duplex Doppler ultrasound was utilized to evaluate blood flow to the
ovaries.

[Series 1: us transvaginal non-ob · 0.24mm/px · 65 acquisitions, 13 frames shown]
[im 1/65]
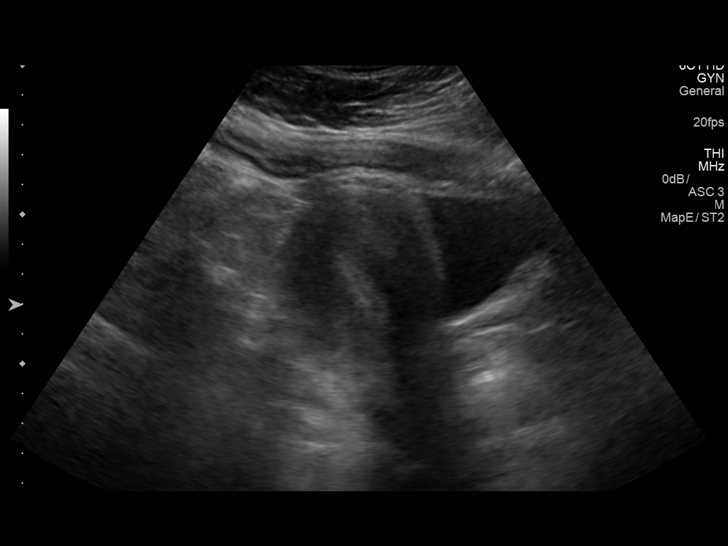
[im 6/65]
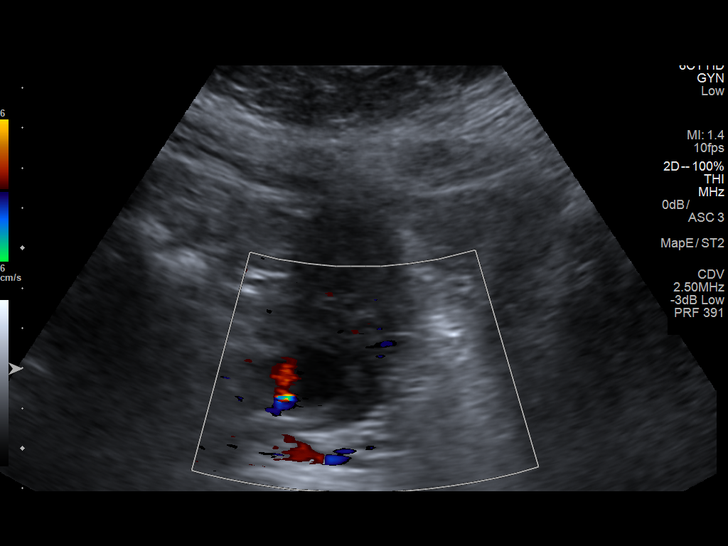
[im 11/65]
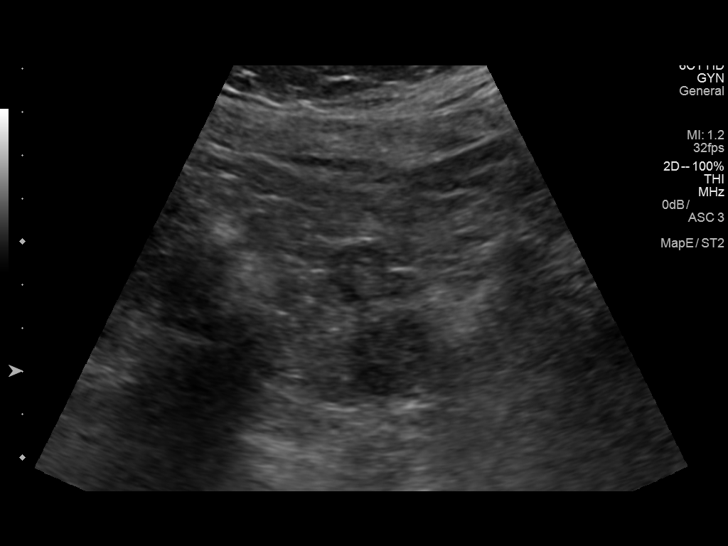
[im 17/65]
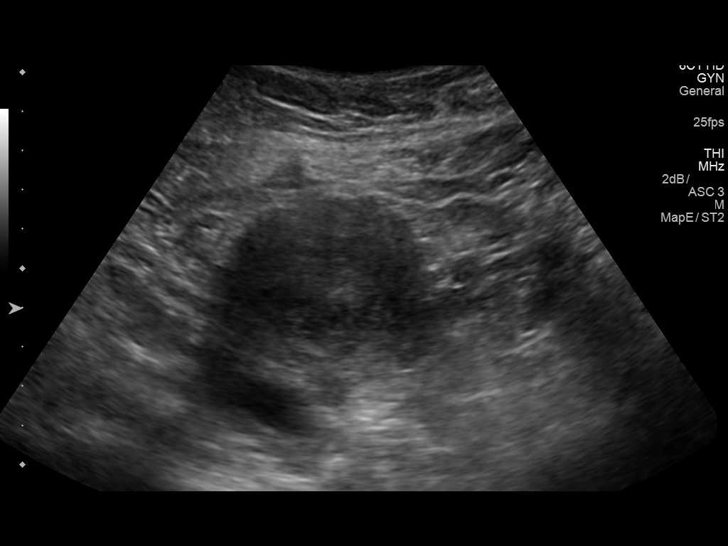
[im 22/65]
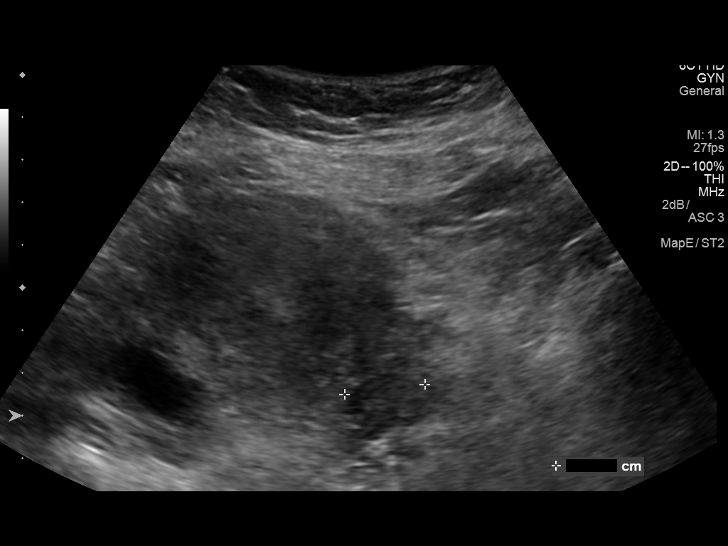
[im 27/65]
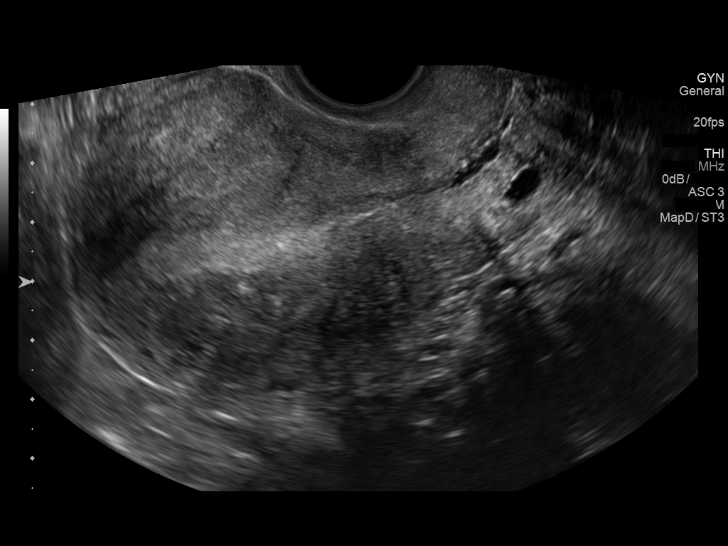
[im 33/65]
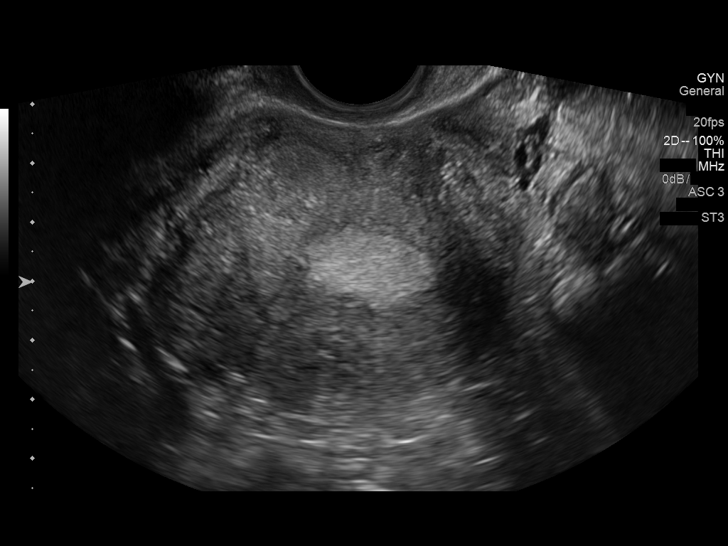
[im 38/65]
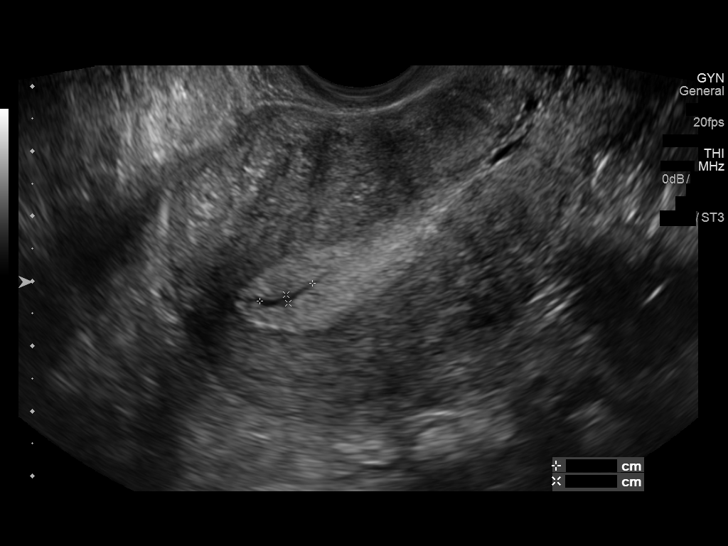
[im 43/65]
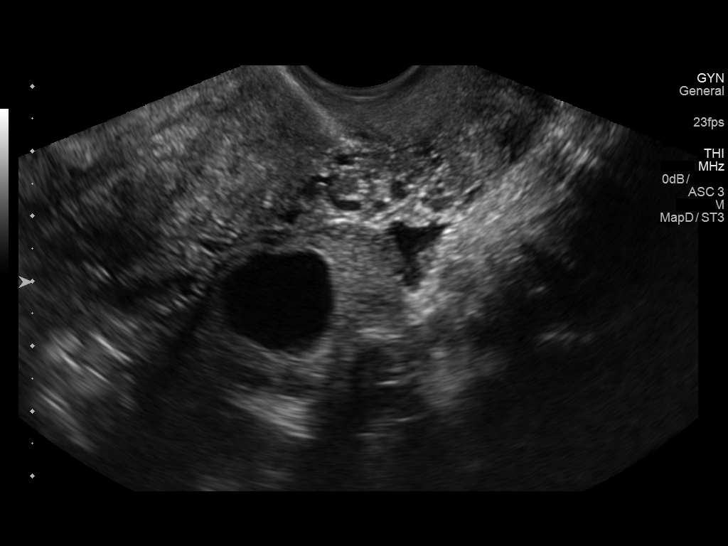
[im 49/65]
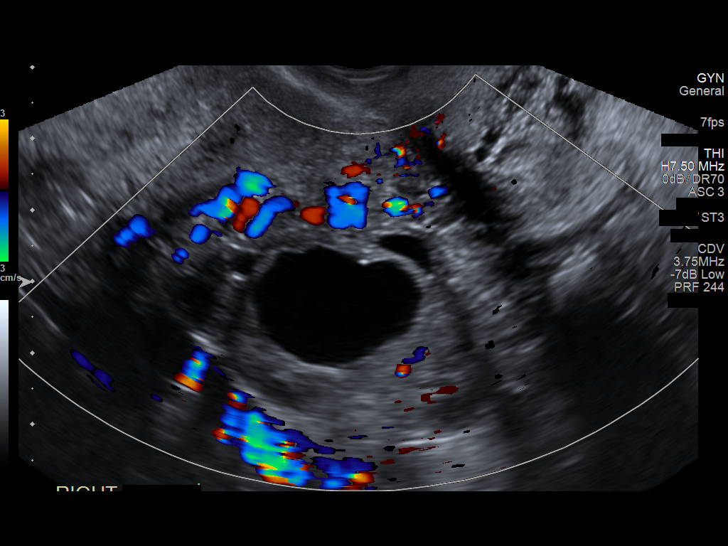
[im 54/65]
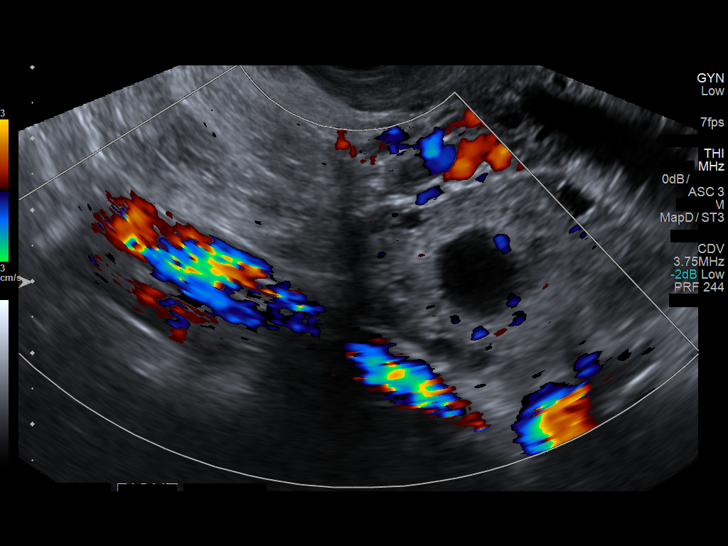
[im 59/65]
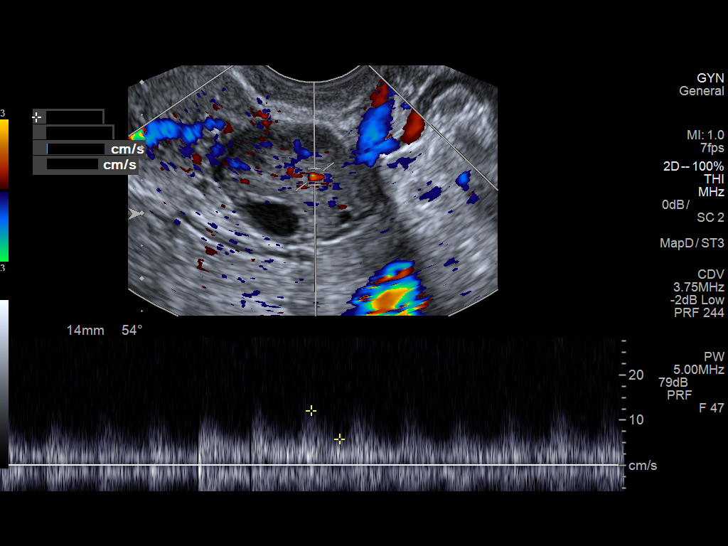
[im 65/65]
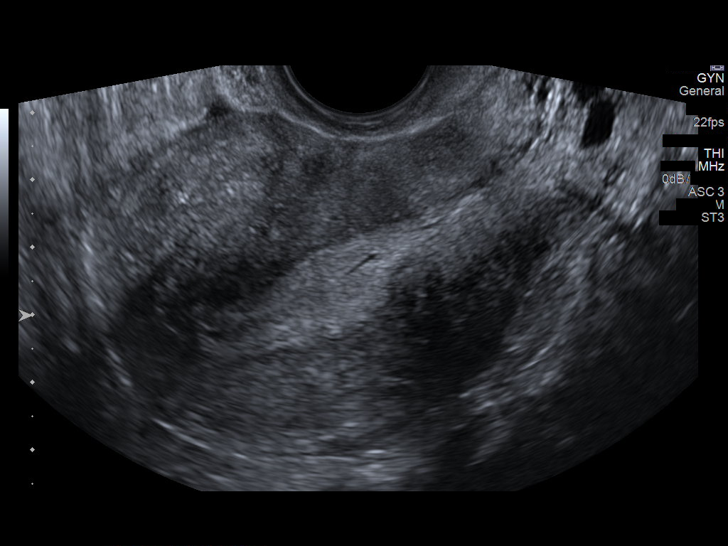

[13 of 25 positions shown; findings below may reference images not displayed]

FINDINGS: Uterus

Measurements: 9.8 x 5.6 x 5.2 cm. No fibroids or other mass
visualized.

Endometrium

Thickness: 11 mm. Small amount of fluid within the endometrial
canal.

Right ovary

Measurements: 3.6 x 2.6 x 3.2 cm. There is a 2.0 x 1.5 x 2.4 cm cyst
within the right ovary.

Left ovary

Measurements: 2.7 x 2.0 x 2.7 cm. Normal appearance/no adnexal mass.

Pulsed Doppler evaluation of both ovaries demonstrates normal
low-resistance arterial and venous waveforms.

Other findings

No abnormal free fluid.
IMPRESSION: No sonographic evidence to suggest ovarian torsion.

## 2017-11-07 IMAGING — DX DG HAND COMPLETE 3+V*L*
3 series · 3 of 3 positions shown · non-contrast
Comparison: Left forearm radiographs -earlier same day

CLINICAL DATA: Distal left ulnar pain and swelling which extends
into the wrist and metacarpals for the past 2 days. No known injury.

EXAM:
LEFT HAND - COMPLETE 3+ VIEW

[hand pa]
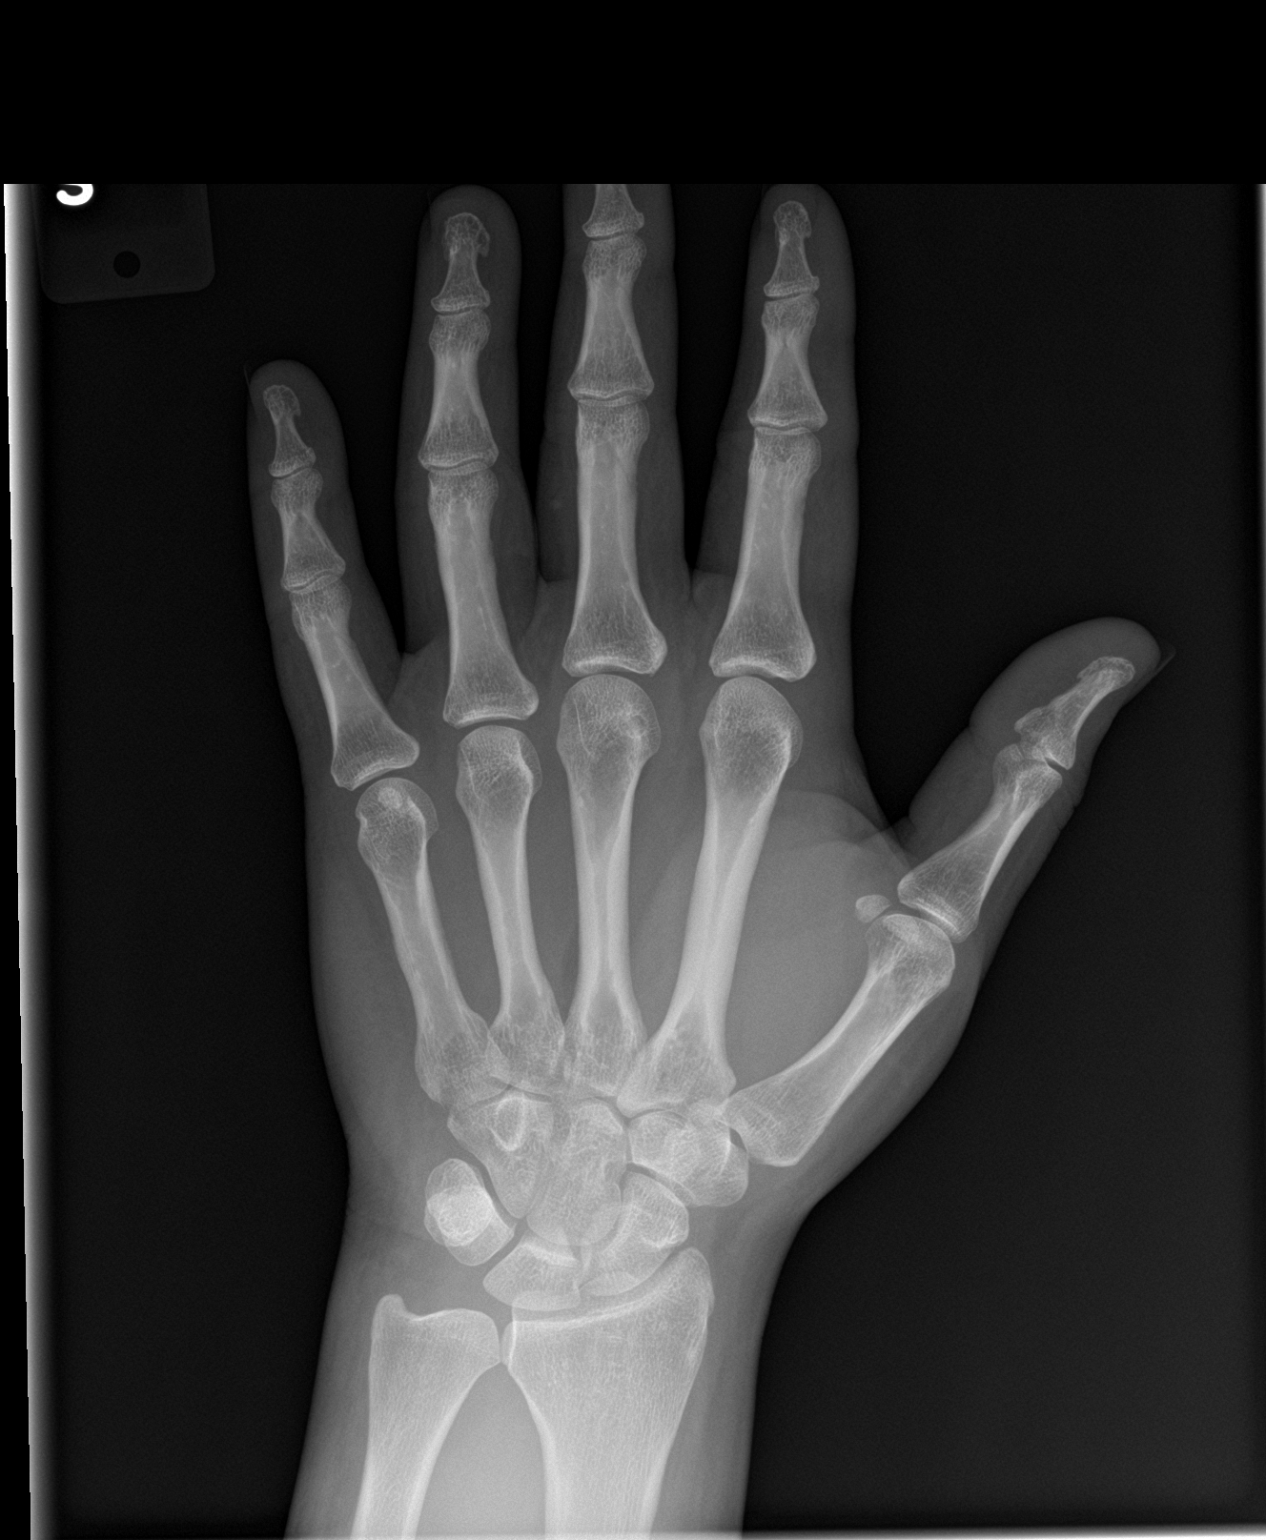

[hand obl]
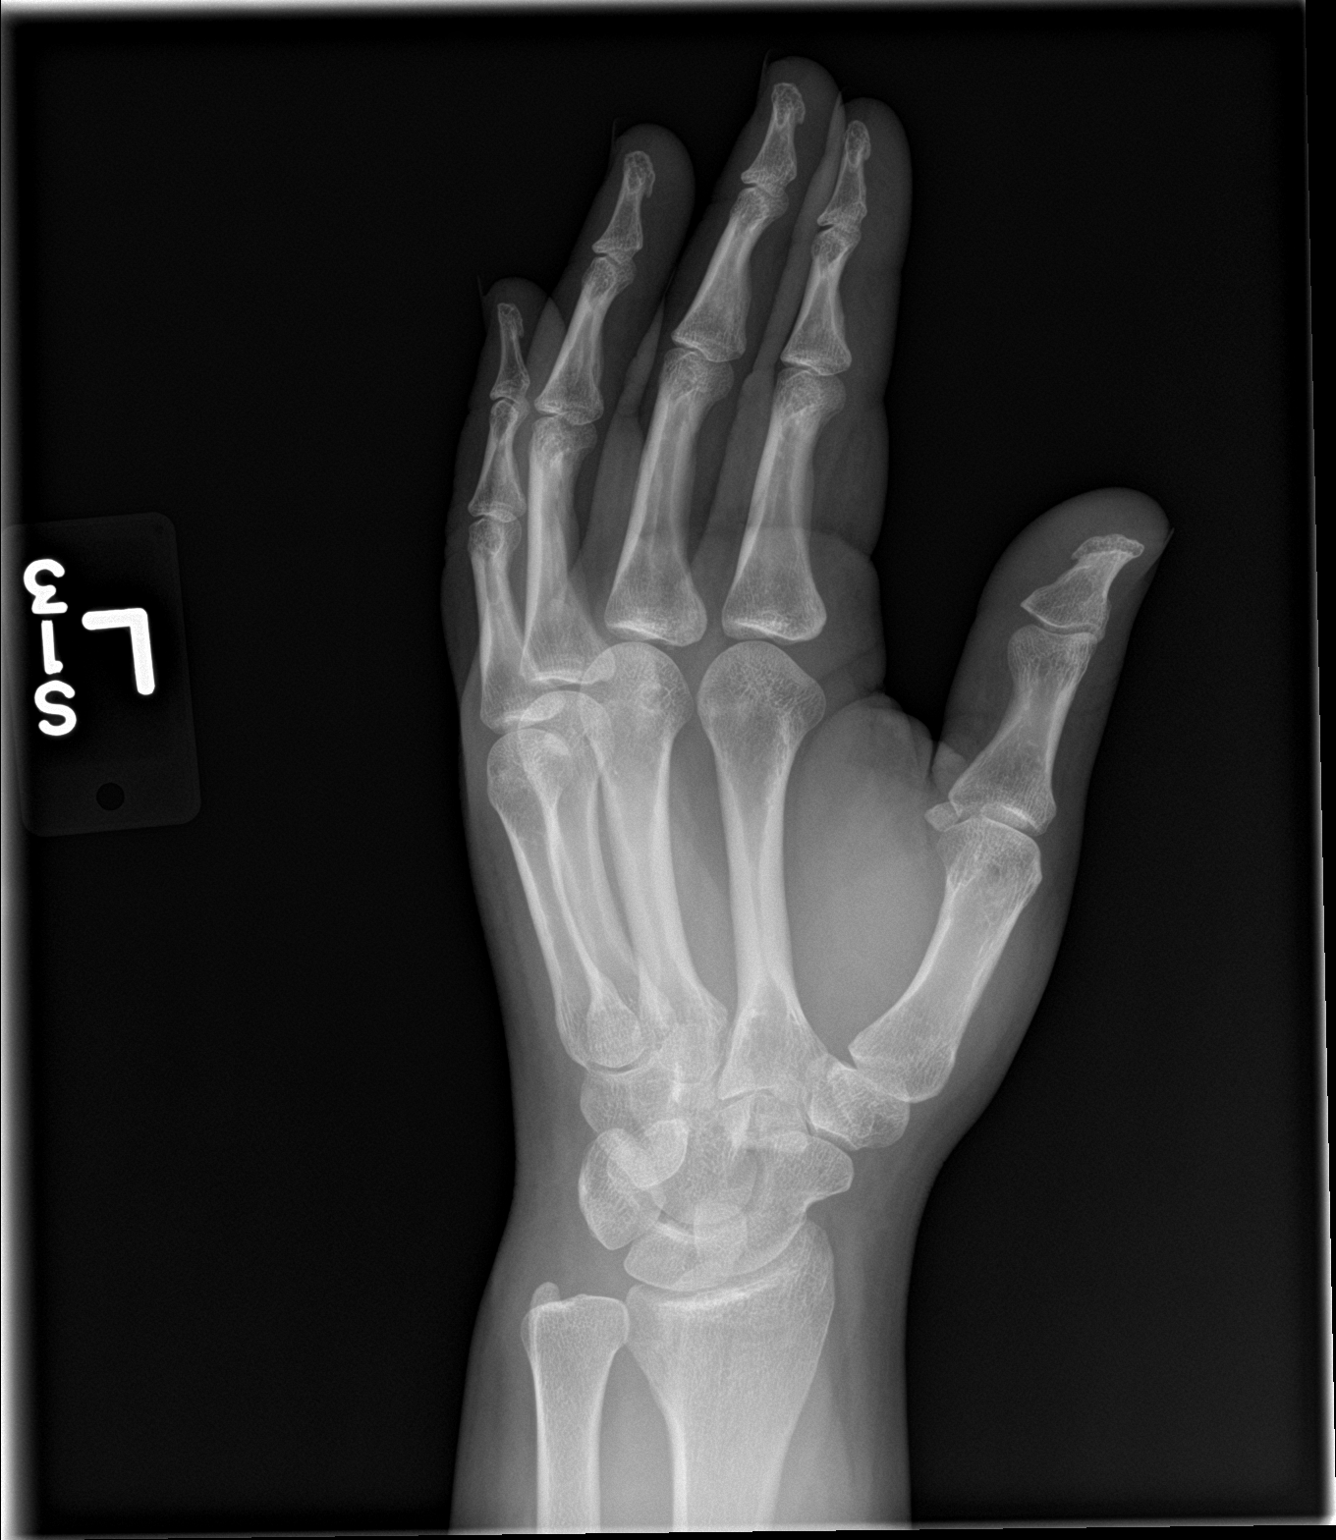

[hand lat]
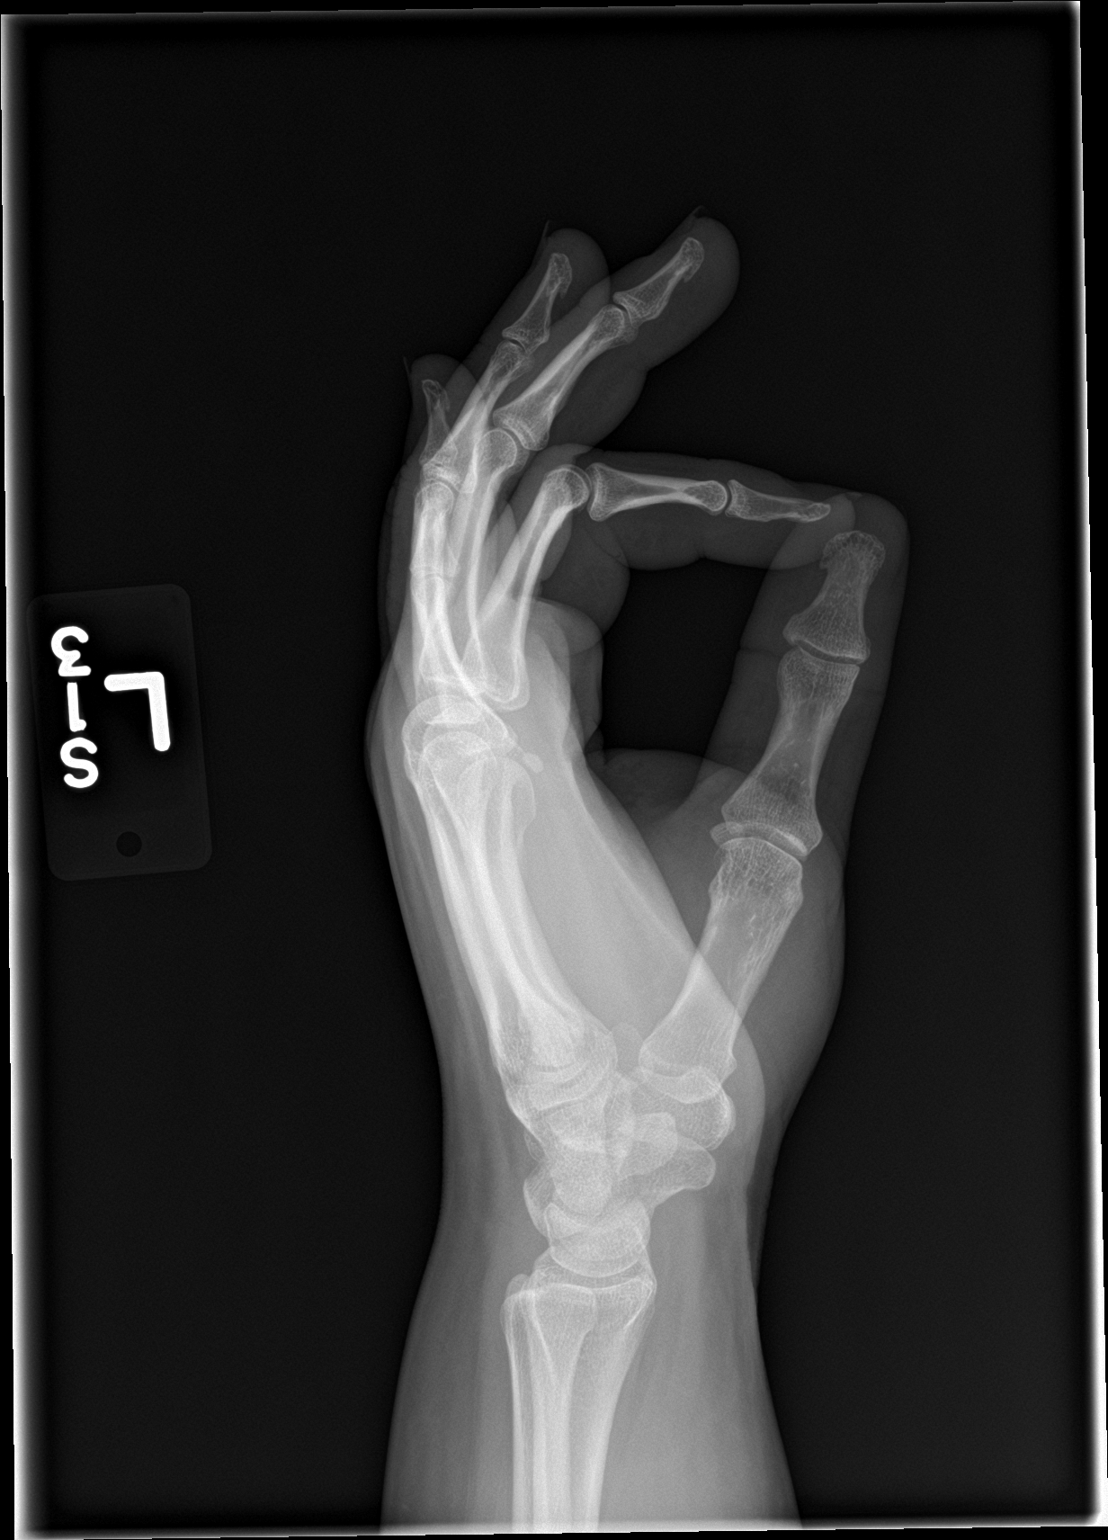

[3 of 3 positions shown; findings below may reference images not displayed]

FINDINGS: No fracture or dislocation. Joint spaces are preserved. No erosions.
No evidence of chondrocalcinosis. Regional soft tissues appear
normal. No radiopaque foreign body.
IMPRESSION: No explanation for patient's left forearm, wrist and hand pain.

## 2017-11-07 IMAGING — DX DG FOREARM 2V*L*
2 series · 2 of 2 positions shown · non-contrast
Comparison: Left hand radiographs - earlier same day

CLINICAL DATA: Distal left ulnar pain and swelling which extends
into the wrist and metacarpals for the past 2 days. No known injury

EXAM:
LEFT FOREARM - 2 VIEW

[forearm ap]
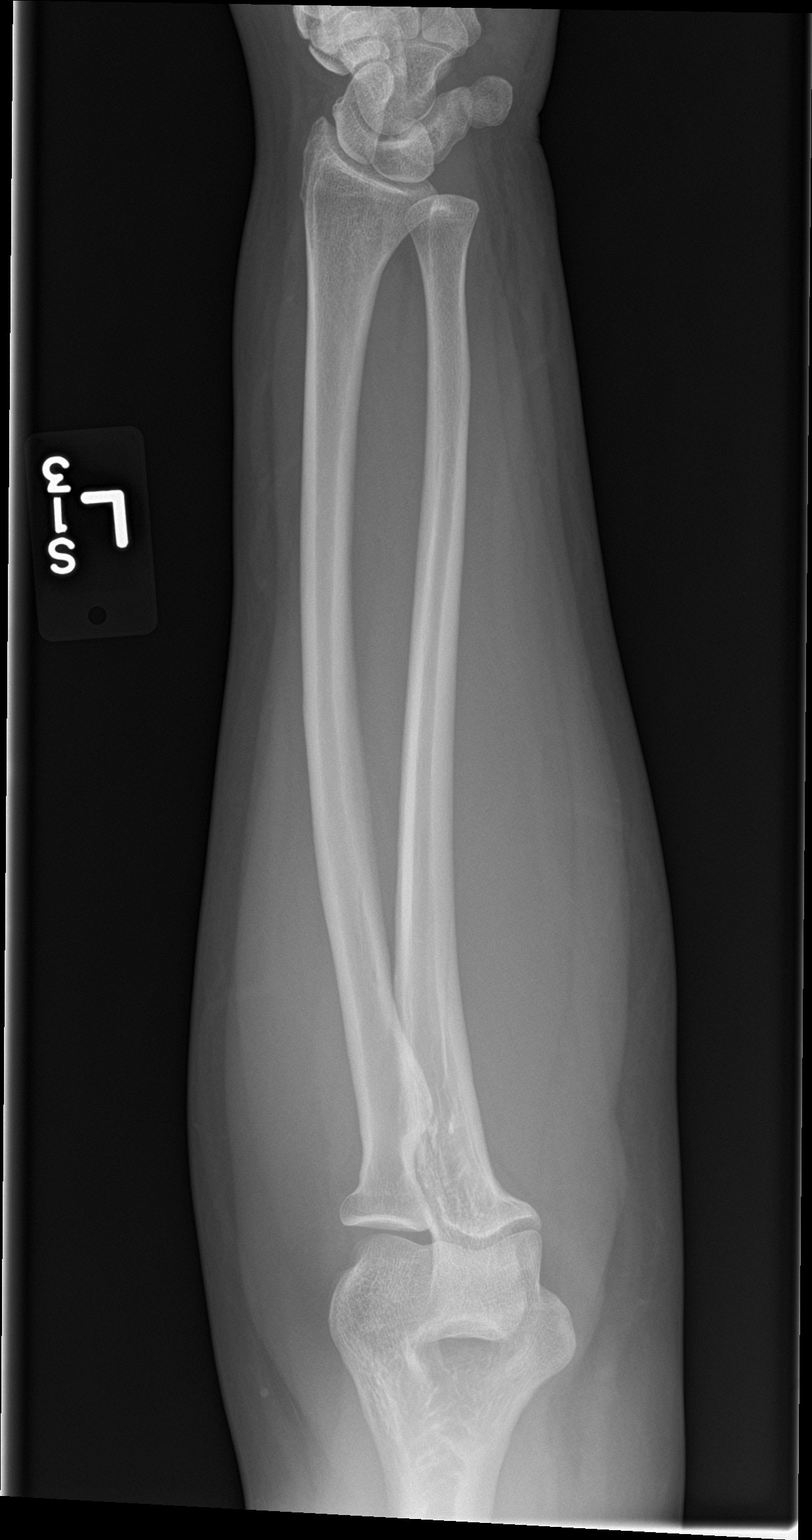

[forearm lat]
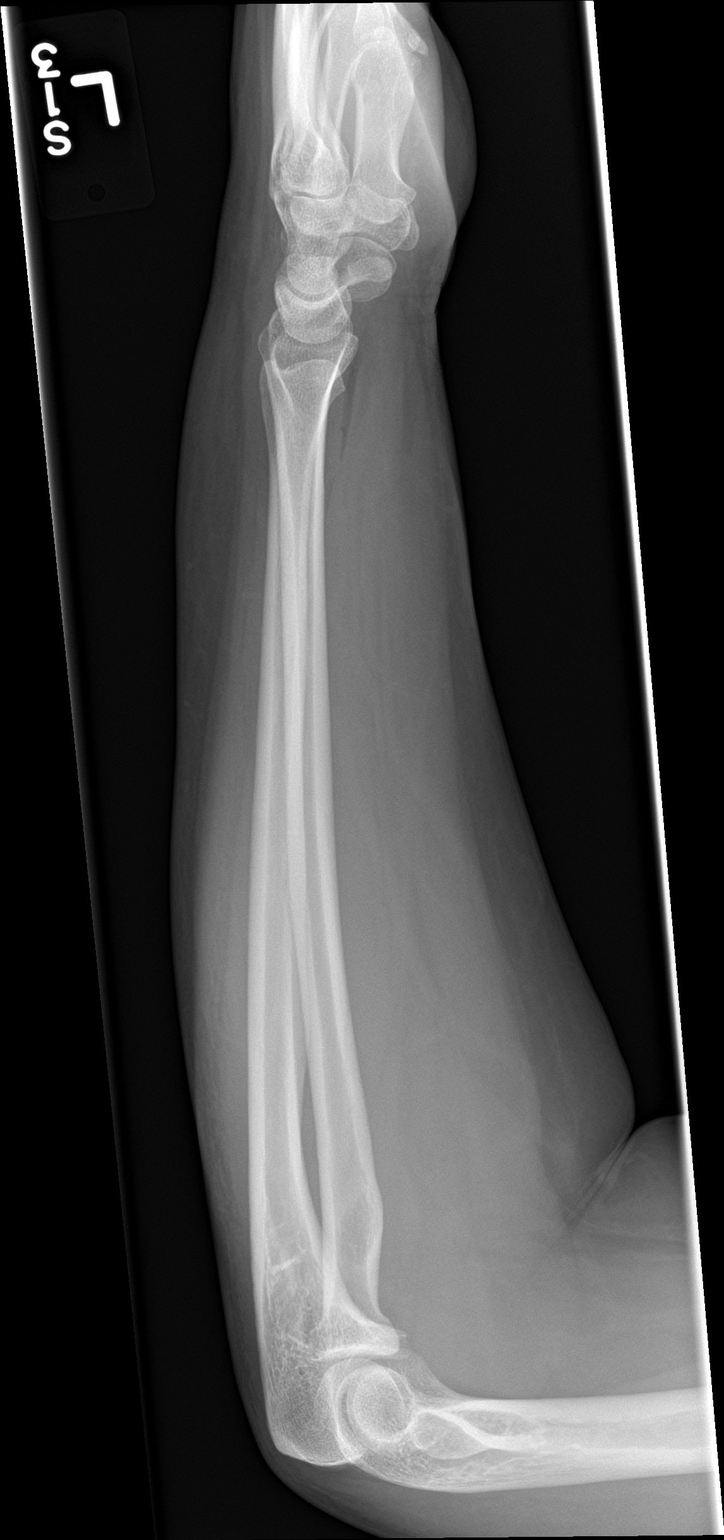

[2 of 2 positions shown; findings below may reference images not displayed]

FINDINGS: No fracture or dislocation. Wrist and elbow joints appear preserved
given obliquity and large field of view. No definite elbow joint
effusion. No definite displacement of the pronator quadratus fat
pad. Regional soft tissues appear normal. No radiopaque foreign
body.
IMPRESSION: No explanation for patient's distal forearm, wrist and hand pain.

## 2018-04-23 ENCOUNTER — Emergency Department (HOSPITAL_COMMUNITY)
Admission: EM | Admit: 2018-04-23 | Discharge: 2018-04-23 | Disposition: A | Discharge status: Home / Self Care | Payer: No Typology Code available for payment source | Attending: Emergency Medicine | Admitting: Emergency Medicine

## 2018-04-23 ENCOUNTER — Encounter (HOSPITAL_COMMUNITY): Payer: Self-pay | Admitting: *Deleted

## 2018-04-23 ENCOUNTER — Other Ambulatory Visit: Payer: Self-pay

## 2018-04-23 DIAGNOSIS — K219 Gastro-esophageal reflux disease without esophagitis: Secondary | ICD-10-CM | POA: Insufficient documentation

## 2018-04-23 DIAGNOSIS — Z79899 Other long term (current) drug therapy: Secondary | ICD-10-CM | POA: Diagnosis not present

## 2018-04-23 DIAGNOSIS — J45909 Unspecified asthma, uncomplicated: Secondary | ICD-10-CM | POA: Insufficient documentation

## 2018-04-23 DIAGNOSIS — R1013 Epigastric pain: Secondary | ICD-10-CM | POA: Diagnosis present

## 2018-04-23 HISTORY — DX: Gastritis, unspecified, without bleeding: K29.70

## 2018-04-23 HISTORY — DX: Migraine, unspecified, not intractable, without status migrainosus: G43.909

## 2018-04-23 LAB — COMPREHENSIVE METABOLIC PANEL
ALT: 35 U/L (ref 0–44)
AST: 28 U/L (ref 15–41)
Albumin: 4.3 g/dL (ref 3.5–5.0)
Alkaline Phosphatase: 46 U/L (ref 38–126)
Anion gap: 8 (ref 5–15)
BUN: 11 mg/dL (ref 6–20)
CHLORIDE: 105 mmol/L (ref 98–111)
CO2: 23 mmol/L (ref 22–32)
Calcium: 8.9 mg/dL (ref 8.9–10.3)
Creatinine, Ser: 0.63 mg/dL (ref 0.44–1.00)
Glucose, Bld: 109 mg/dL — ABNORMAL HIGH (ref 70–99)
POTASSIUM: 3.5 mmol/L (ref 3.5–5.1)
SODIUM: 136 mmol/L (ref 135–145)
Total Bilirubin: 0.5 mg/dL (ref 0.3–1.2)
Total Protein: 7.5 g/dL (ref 6.5–8.1)

## 2018-04-23 LAB — URINALYSIS, ROUTINE W REFLEX MICROSCOPIC
Bilirubin Urine: NEGATIVE
Glucose, UA: NEGATIVE mg/dL
Hgb urine dipstick: NEGATIVE
Ketones, ur: NEGATIVE mg/dL
LEUKOCYTE UA: NEGATIVE
Nitrite: NEGATIVE
PH: 8 (ref 5.0–8.0)
Protein, ur: NEGATIVE mg/dL
SPECIFIC GRAVITY, URINE: 1.018 (ref 1.005–1.030)

## 2018-04-23 LAB — CBC
HEMATOCRIT: 42.1 % (ref 36.0–46.0)
HEMOGLOBIN: 13.5 g/dL (ref 12.0–15.0)
MCH: 30.6 pg (ref 26.0–34.0)
MCHC: 32.1 g/dL (ref 30.0–36.0)
MCV: 95.5 fL (ref 80.0–100.0)
NRBC: 0 % (ref 0.0–0.2)
Platelets: 227 10*3/uL (ref 150–400)
RBC: 4.41 MIL/uL (ref 3.87–5.11)
RDW: 12.4 % (ref 11.5–15.5)
WBC: 8 10*3/uL (ref 4.0–10.5)

## 2018-04-23 LAB — I-STAT BETA HCG BLOOD, ED (MC, WL, AP ONLY): I-stat hCG, quantitative: <5 m[IU]/mL (ref <5)

## 2018-04-23 LAB — LIPASE, BLOOD: LIPASE: 31 U/L (ref 11–51)

## 2018-04-23 MED ORDER — SODIUM CHLORIDE 0.9% FLUSH
3.0000 mL | Freq: Once | INTRAVENOUS | Status: AC
  Administered 2018-04-23: 3 mL via INTRAVENOUS

## 2018-04-23 MED ORDER — ALUM & MAG HYDROXIDE-SIMETH 200-200-20 MG/5ML PO SUSP
30.0000 mL | Freq: Once | ORAL | Status: AC
Start: 2018-04-23 — End: 2018-04-23
  Administered 2018-04-23: 30 mL via ORAL
  Filled 2018-04-23: qty 30

## 2018-04-23 MED ORDER — SODIUM CHLORIDE 0.9 % IV BOLUS
500.0000 mL | Freq: Once | INTRAVENOUS | Status: AC
  Administered 2018-04-23: 500 mL via INTRAVENOUS

## 2018-04-23 MED ORDER — ONDANSETRON HCL 4 MG/2ML IJ SOLN
4.0000 mg | Freq: Once | INTRAMUSCULAR | Status: AC
  Administered 2018-04-23: 4 mg via INTRAVENOUS
  Filled 2018-04-23: qty 2

## 2018-04-23 MED ORDER — FAMOTIDINE 20 MG PO TABS: 20.0000 mg | ORAL_TABLET | Freq: Two times a day (BID) | ORAL | 0 refills | Status: DC

## 2018-04-23 MED ORDER — LIDOCAINE VISCOUS HCL 2 % MT SOLN
15.0000 mL | Freq: Once | OROMUCOSAL | Status: AC
  Administered 2018-04-23: 15 mL via ORAL
  Filled 2018-04-23: qty 15

## 2018-04-23 MED ORDER — ACETAMINOPHEN 325 MG PO TABS
650.0000 mg | ORAL_TABLET | Freq: Once | ORAL | Status: AC
  Administered 2018-04-23: 650 mg via ORAL
  Filled 2018-04-23: qty 2

## 2018-04-23 NOTE — ED Provider Notes (Signed)
[facility]-EMERGENCY DEPT Provider Note   CSN: [id number] Arrival date & time: 04/23/18  1255     History   Chief Complaint Chief Complaint  Patient presents with  . Abdominal Pain    HPI Pamela Macias is a 38 y.o. female presenting with acute onset epigastric pain that began yesterday. Patient had made herself a breakfast shake earlier that morning, and the pain began soon after she arrived to work. She describes the pain as cramping/gurgling and is non-radiating. She took a Prilosec which relieved her symptoms and she was able to eat normally. This morning, her pain returned with associated nausea and 1 episode of non-bloody, non-bilious emesis upon arrival to the ED. She has not had anything to eat today. Endorses increased belching. She denies any fevers, chills, nasal congestion, cough, shortness of breath, or changes in bowel movements. This pain feels similar to when she has had gastritis in the past. She has been prescribed Omeprazole to take daily, but she has not been taking it lately since she has not had symptoms. No excessive EtOH or NSAID use.   Past Medical History:  Diagnosis Date  . Asthma   . Gastritis   . Migraine headache     There are no active problems to display for this patient.   Past Surgical History:  Procedure Laterality Date  . TUBAL LIGATION       OB History   No obstetric history on file.      Home Medications    Prior to Admission medications   Medication Sig Start Date End Date Taking? Authorizing Provider  butalbital-acetaminophen-caffeine (FIORICET, ESGIC) 50-325-40 MG tablet Take 1 tablet by mouth 2 (two) times daily as needed for headache.    Yes [provider]  omeprazole (PRILOSEC) 20 MG capsule Take 1 capsule (20 mg total) by mouth daily. 09/22/16  Yes Roxy HorsemanBrowning, Robert, PA-C  benzonatate (TESSALON) 100 MG capsule Take 1 capsule (100 mg total) by mouth every 8 (eight) hours. Patient not taking:  Reported on 09/22/2016 05/12/16   Anselm PancoastJoy, Shawn C, PA-C  famotidine (PEPCID) 20 MG tablet Take 1 tablet (20 mg total) by mouth 2 (two) times daily for 7 days. 04/23/18 04/30/18  Avelino Herren D, DO  ibuprofen (ADVIL,MOTRIN) 800 MG tablet Take 1 tablet (800 mg total) by mouth 3 (three) times daily. Patient not taking: Reported on 09/22/2016 05/12/16   Joy, Hillard DankerShawn C, PA-C  predniSONE (STERAPRED UNI-PAK 21 TAB) 10 MG (21) TBPK tablet Take 6 tabs day 1, 5 tabs day 2, 4 tabs day 3, 3 tabs day 4, 2 tabs day 5, and 1 tab on day 6. Patient not taking: Reported on 09/22/2016 05/12/16   Harolyn RutherfordJoy, Shawn C, PA-C  sucralfate (CARAFATE) 1 GM/10ML suspension Take 10 mLs (1 g total) by mouth 4 (four) times daily -  with meals and at bedtime. Patient not taking: Reported on 04/23/2018 09/22/16   Roxy HorsemanBrowning, Robert, PA-C    Family History No family history on file.  Social History Social History   Tobacco Use  . Smoking status: Never Smoker  . Smokeless tobacco: Never Used  Substance Use Topics  . Alcohol use: No  . Drug use: No     Allergies   Ancef [cefazolin]   Review of Systems Review of Systems  All other systems reviewed and are negative.    Physical Exam Updated Vital Signs BP 111/85 (BP Location: Right Arm)   Pulse 80   Temp 98.4 F (36.9 C) (Oral)  Resp 15   Ht 5\' 2"  (1.575 m)   Wt 89.8 kg   LMP 03/31/2018   SpO2 100%   BMI 36.21 kg/m   Physical Exam Vitals signs reviewed.  Constitutional:      General: She is not in acute distress.    Appearance: She is well-developed.  HENT:     Head: Normocephalic and atraumatic.     Mouth/Throat:     Mouth: Mucous membranes are moist.     Pharynx: Oropharynx is clear.  Eyes:     General: No scleral icterus. Cardiovascular:     Rate and Rhythm: Normal rate and regular rhythm.     Heart sounds: Normal heart sounds.  Pulmonary:     Effort: Pulmonary effort is normal.     Breath sounds: Normal breath sounds.  Abdominal:     General: Bowel  sounds are normal.     Palpations: Abdomen is soft.     Tenderness: There is abdominal tenderness in the epigastric area. There is no guarding or rebound.  Skin:    General: Skin is warm and dry.  Neurological:     General: No focal deficit present.     Mental Status: She is alert and oriented to person, place, and time.      ED Treatments / Results  Labs (all labs ordered are listed, but only abnormal results are displayed) Labs Reviewed  COMPREHENSIVE METABOLIC PANEL - Abnormal; Notable for the following components:      Result Value   Glucose, Bld 109 (*)    All other components within normal limits  URINALYSIS, ROUTINE W REFLEX MICROSCOPIC - Abnormal; Notable for the following components:   APPearance HAZY (*)    All other components within normal limits  LIPASE, BLOOD  CBC  I-STAT BETA HCG BLOOD, ED ([facility], [facility], [facility] ONLY)    EKG None  Radiology No results found.  Procedures Procedures (including critical care time)  Medications Ordered in ED Medications  sodium chloride 0.9 % bolus 500 mL (500 mLs Intravenous New Bag/Given 04/23/18 1702)  sodium chloride flush (NS) 0.9 % injection 3 mL (3 mLs Intravenous Given 04/23/18 1557)  ondansetron (ZOFRAN) injection 4 mg (4 mg Intravenous Given 04/23/18 1704)  acetaminophen (TYLENOL) tablet 650 mg (650 mg Oral Given 04/23/18 1704)  alum & mag hydroxide-simeth (MAALOX/MYLANTA) 200-200-20 MG/5ML suspension 30 mL (30 mLs Oral Given 04/23/18 1704)    And  lidocaine (XYLOCAINE) 2 % viscous mouth solution 15 mL (15 mLs Oral Given 04/23/18 1704)     Initial Impression / Assessment and Plan / ED Course  I have reviewed the triage vital signs and the nursing notes.  Pertinent labs & imaging results that were available during my care of the patient were reviewed by me and considered in my medical decision making (see chart for details).   38 y/o otherwise healthy female who presents with 2 day history of epigastric pain with associated  n/v. Exam is reassuring. She is afebrile without leukocytosis. No history of excessive EtOH or NSAID use. CMP and lipase are pending.  Symptoms seem most consistent with uncontrolled GERD. Will try GI cocktail and monitor for improvement.   Final Clinical Impressions(s) / ED Diagnoses   Final diagnoses:  Gastroesophageal reflux disease, esophagitis presence not specified   Patient's work-up unremarkable. Her pain is well controlled. Have instructed her to take Pepcid twice daily for a week and would recommend taking her Omeprazole daily to prevent recurrence of symptoms. She is medically stable for  discharge.   ED Discharge Orders         Ordered    famotidine (PEPCID) 20 MG tablet  2 times daily     04/23/18 1744           Lenward Chancellor D, DO 04/23/18 1758    Melene Plan, DO 04/23/18 2341

## 2018-04-23 NOTE — Discharge Instructions (Signed)
You were treated in the ED for reflux gastritis. Please take Pepcid twice daily for the next week until your symptoms have resolved. You can continue taking your Omeprazole daily to prevent symptom recurrence afterwards.  I have provided the number for a local primary doctor that I'd encourage you to establish care with to follow-up on your acid reflux.   Usted fue tratado en el servicio de urgencias por gastritis por reflujo. Tome [employer] veces al da durante la prxima semana hasta que sus sntomas se hayan resuelto. Puede continuar tomando su Omeprazol diariamente para prevenir la recurrencia de los sntomas despus. He proporcionado el nmero de un mdico primario local con el que te animo a que establezcas atencin para el seguimiento de tu reflujo cido.

## 2018-04-23 NOTE — ED Triage Notes (Signed)
Pt endorses mid epigastric pain since yesterday with nausea.  She took a prilosec yesterday with some relief.  Today, she took another prilosec without relief.  She also endorses h/a.  She has hx of gastritis.

## 2019-11-14 ENCOUNTER — Other Ambulatory Visit: Payer: Self-pay | Admitting: Critical Care Medicine

## 2019-11-14 ENCOUNTER — Other Ambulatory Visit: Payer: No Typology Code available for payment source

## 2019-11-14 DIAGNOSIS — Z20822 Contact with and (suspected) exposure to covid-19: Secondary | ICD-10-CM

## 2019-11-15 LAB — NOVEL CORONAVIRUS, NAA: SARS-CoV-2, NAA: NOT DETECTED

## 2020-09-18 ENCOUNTER — Other Ambulatory Visit: Payer: Self-pay

## 2020-09-18 ENCOUNTER — Emergency Department (HOSPITAL_COMMUNITY)
Admission: EM | Admit: 2020-09-18 | Discharge: 2020-09-19 | Disposition: A | Discharge status: Home / Self Care | Payer: BC Managed Care – PPO | Attending: Emergency Medicine | Admitting: Emergency Medicine

## 2020-09-18 ENCOUNTER — Encounter (HOSPITAL_COMMUNITY): Payer: Self-pay

## 2020-09-18 DIAGNOSIS — R197 Diarrhea, unspecified: Secondary | ICD-10-CM | POA: Insufficient documentation

## 2020-09-18 DIAGNOSIS — J45909 Unspecified asthma, uncomplicated: Secondary | ICD-10-CM | POA: Insufficient documentation

## 2020-09-18 DIAGNOSIS — R110 Nausea: Secondary | ICD-10-CM | POA: Insufficient documentation

## 2020-09-18 DIAGNOSIS — N9489 Other specified conditions associated with female genital organs and menstrual cycle: Secondary | ICD-10-CM | POA: Insufficient documentation

## 2020-09-18 DIAGNOSIS — R1084 Generalized abdominal pain: Secondary | ICD-10-CM | POA: Diagnosis not present

## 2020-09-18 LAB — URINALYSIS, ROUTINE W REFLEX MICROSCOPIC
Bacteria, UA: NONE SEEN
Bilirubin Urine: NEGATIVE
Glucose, UA: NEGATIVE mg/dL
Hgb urine dipstick: NEGATIVE
Ketones, ur: NEGATIVE mg/dL
Leukocytes,Ua: NEGATIVE
Nitrite: NEGATIVE
Protein, ur: 30 mg/dL — AB
Specific Gravity, Urine: 1.023 (ref 1.005–1.030)
pH: 7 (ref 5.0–8.0)

## 2020-09-18 LAB — COMPREHENSIVE METABOLIC PANEL
ALT: 31 U/L (ref 0–44)
AST: 24 U/L (ref 15–41)
Albumin: 4 g/dL (ref 3.5–5.0)
Alkaline Phosphatase: 52 U/L (ref 38–126)
Anion gap: 7 (ref 5–15)
BUN: 14 mg/dL (ref 6–20)
CO2: 26 mmol/L (ref 22–32)
Calcium: 9 mg/dL (ref 8.9–10.3)
Chloride: 106 mmol/L (ref 98–111)
Creatinine, Ser: 1.46 mg/dL — ABNORMAL HIGH (ref 0.44–1.00)
GFR, Estimated: 47 mL/min — ABNORMAL LOW (ref >60)
Glucose, Bld: 139 mg/dL — ABNORMAL HIGH (ref 70–99)
Potassium: 3.9 mmol/L (ref 3.5–5.1)
Sodium: 139 mmol/L (ref 135–145)
Total Bilirubin: 0.4 mg/dL (ref 0.3–1.2)
Total Protein: 7.4 g/dL (ref 6.5–8.1)

## 2020-09-18 LAB — CBC
HCT: 39.9 % (ref 36.0–46.0)
Hemoglobin: 12.8 g/dL (ref 12.0–15.0)
MCH: 29.2 pg (ref 26.0–34.0)
MCHC: 32.1 g/dL (ref 30.0–36.0)
MCV: 91.1 fL (ref 80.0–100.0)
Platelets: 248 10*3/uL (ref 150–400)
RBC: 4.38 MIL/uL (ref 3.87–5.11)
RDW: 13.2 % (ref 11.5–15.5)
WBC: 7.2 10*3/uL (ref 4.0–10.5)
nRBC: 0 % (ref 0.0–0.2)

## 2020-09-18 LAB — LIPASE, BLOOD: Lipase: 32 U/L (ref 11–51)

## 2020-09-18 LAB — I-STAT BETA HCG BLOOD, ED (MC, WL, AP ONLY): I-stat hCG, quantitative: <5 m[IU]/mL (ref <5)

## 2020-09-18 MED ORDER — ONDANSETRON HCL 4 MG/2ML IJ SOLN
4.0000 mg | Freq: Once | INTRAMUSCULAR | Status: AC
  Administered 2020-09-18: 4 mg via INTRAVENOUS
  Filled 2020-09-18: qty 2

## 2020-09-18 MED ORDER — SODIUM CHLORIDE 0.9 % IV BOLUS
2000.0000 mL | Freq: Once | INTRAVENOUS | Status: AC
  Administered 2020-09-18: 2000 mL via INTRAVENOUS

## 2020-09-18 MED ORDER — DICYCLOMINE HCL 10 MG PO CAPS
10.0000 mg | ORAL_CAPSULE | Freq: Once | ORAL | Status: AC
  Administered 2020-09-18: 10 mg via ORAL
  Filled 2020-09-18: qty 1

## 2020-09-18 NOTE — ED Provider Notes (Signed)
[facility]-EMERGENCY DEPT Provider Note   CSN: [id number] Arrival date & time: 09/18/20  2048     History Chief Complaint  Patient presents with   Abdominal Pain    Pamela Macias is a 40 y.o. female.  40 year old female with a history of asthma, gastritis, migraine headaches presents to the emergency department for complaints of nausea and diarrhea as well as generalized abdominal discomfort.  Her symptoms began this afternoon upon returning home.  She had previously eaten at a restaurant.  Had rice, beans, pork, plan teens.  Subsequently went to get ice cream.  Has taken some omeprazole for her symptoms without relief.  Abdominal discomfort is cramping in nature, waxing and waning in severity.  She has not had any fevers, vomiting, urinary symptoms.  Abdominal surgical history significant for tubal ligation.  The history is provided by the patient. No language interpreter was used.  Abdominal Pain     Past Medical History:  Diagnosis Date   Asthma    Gastritis    Migraine headache     There are no problems to display for this patient.   Past Surgical History:  Procedure Laterality Date   TUBAL LIGATION       OB History   No obstetric history on file.     History reviewed. No pertinent family history.  Social History   Tobacco Use   Smoking status: Never   Smokeless tobacco: Never  Substance Use Topics   Alcohol use: No   Drug use: No    Home Medications Prior to Admission medications   Medication Sig Start Date End Date Taking? Authorizing Provider  dicyclomine (BENTYL) 20 MG tablet Take 1 tablet (20 mg total) by mouth every 12 (twelve) hours as needed (for abdominal cramping). 09/19/20  Yes Antony Madura, PA-C  ondansetron (ZOFRAN ODT) 4 MG disintegrating tablet Take 1 tablet (4 mg total) by mouth every 8 (eight) hours as needed for nausea or vomiting. 09/19/20  Yes Antony Madura, PA-C  benzonatate (TESSALON) 100 MG capsule Take 1  capsule (100 mg total) by mouth every 8 (eight) hours. Patient not taking: Reported on 09/22/2016 05/12/16   Joy, Ines Bloomer C, PA-C  butalbital-acetaminophen-caffeine (FIORICET, ESGIC) [phone] MG tablet Take 1 tablet by mouth 2 (two) times daily as needed for headache.     [provider]  famotidine (PEPCID) 20 MG tablet Take 1 tablet (20 mg total) by mouth 2 (two) times daily for 7 days. 04/23/18 04/30/18  Bloomfield, Carley D, DO  ibuprofen (ADVIL,MOTRIN) 800 MG tablet Take 1 tablet (800 mg total) by mouth 3 (three) times daily. Patient not taking: Reported on 09/22/2016 05/12/16   Anselm Pancoast, PA-C  omeprazole (PRILOSEC) 20 MG capsule Take 1 capsule (20 mg total) by mouth daily. 09/22/16   Roxy Horseman, PA-C  predniSONE (STERAPRED UNI-PAK 21 TAB) 10 MG (21) TBPK tablet Take 6 tabs day 1, 5 tabs day 2, 4 tabs day 3, 3 tabs day 4, 2 tabs day 5, and 1 tab on day 6. Patient not taking: Reported on 09/22/2016 05/12/16   Harolyn Rutherford C, PA-C  sucralfate (CARAFATE) 1 GM/10ML suspension Take 10 mLs (1 g total) by mouth 4 (four) times daily -  with meals and at bedtime. Patient not taking: Reported on 04/23/2018 09/22/16   Roxy Horseman, PA-C    Allergies    Ancef [cefazolin]  Review of Systems   Review of Systems  Gastrointestinal:  Positive for abdominal pain.  Ten systems reviewed and  are negative for acute change, except as noted in the HPI.    Physical Exam Updated Vital Signs BP 115/75 (BP Location: Right Arm)   Pulse 98   Temp 98 F (36.7 C) (Oral)   Resp 16   SpO2 99%   Physical Exam Vitals and nursing note reviewed.  Constitutional:      General: She is not in acute distress.    Appearance: She is well-developed. She is not diaphoretic.     Comments: Nontoxic appearing and in NAD  HENT:     Head: Normocephalic and atraumatic.  Eyes:     General: No scleral icterus.    Conjunctiva/sclera: Conjunctivae normal.  Cardiovascular:     Rate and Rhythm: Normal rate and regular  rhythm.     Pulses: Normal pulses.  Pulmonary:     Effort: Pulmonary effort is normal. No respiratory distress.     Comments: Respirations even and unlabored Abdominal:     Palpations: Abdomen is soft.     Tenderness: There is abdominal tenderness (periumbilical).     Comments: Abdomen soft, obese.  No guarding or peritoneal signs.  Musculoskeletal:        General: Normal range of motion.     Cervical back: Normal range of motion.  Skin:    General: Skin is warm and dry.     Coloration: Skin is not pale.     Findings: No erythema or rash.  Neurological:     Mental Status: She is alert and oriented to person, place, and time.     Coordination: Coordination normal.  Psychiatric:        Behavior: Behavior normal.    ED Results / Procedures / Treatments   Labs (all labs ordered are listed, but only abnormal results are displayed) Labs Reviewed  COMPREHENSIVE METABOLIC PANEL - Abnormal; Notable for the following components:      Result Value   Glucose, Bld 139 (*)    Creatinine, Ser 1.46 (*)    GFR, Estimated 47 (*)    All other components within normal limits  URINALYSIS, ROUTINE W REFLEX MICROSCOPIC - Abnormal; Notable for the following components:   Protein, ur 30 (*)    All other components within normal limits  LIPASE, BLOOD  CBC  I-STAT BETA HCG BLOOD, ED ([facility], [facility], [facility] ONLY)    EKG None  Radiology No results found.  Procedures Procedures   Medications Ordered in ED Medications  sodium chloride 0.9 % bolus 2,000 mL (0 mLs Intravenous Stopped 09/19/20 0106)  ondansetron (ZOFRAN) injection 4 mg (4 mg Intravenous Given 09/18/20 2311)  dicyclomine (BENTYL) capsule 10 mg (10 mg Oral Given 09/18/20 2311)  ketorolac (TORADOL) 15 MG/ML injection 15 mg (15 mg Intravenous Given 09/19/20 0110)    ED Course  I have reviewed the triage vital signs and the nursing notes.  Pertinent labs & imaging results that were available during my care of the patient were reviewed by me and  considered in my medical decision making (see chart for details).  Clinical Course as of 09/21/20 0544  Wynelle Link Sep 19, 2020  0104 Patient reports that she is feeling better.  Her repeat abdominal exam is stable.  She has not had any vomiting.  Expresses comfort with discharge and outpatient primary care follow-up.  Instructed return for worsening symptoms. [KH]    Clinical Course User Index [KH] Antony Madura, PA-C   MDM Rules/Calculators/A&P  Patient with symptoms consistent with viral vs food-borne illness.  Vitals are stable, no fever.  Tolerating PO fluids after Zofran.  Abdomen soft without masses or peritoneal signs. No focal TTP. Repeat abdominal exam stable.  Labs reviewed and are reassuring. Symptoms improving after Bentyl.   Doubt emergent etiology of symptoms.  Will continue with outpatient management.  Patient advised to follow up wit a primary care doctor.  Return precautions discussed and provided. Patient discharged in stable condition with no unaddressed concerns.   Final Clinical Impression(s) / ED Diagnoses Final diagnoses:  Generalized abdominal pain    Rx / DC Orders ED Discharge Orders          Ordered    ondansetron (ZOFRAN ODT) 4 MG disintegrating tablet  Every 8 hours PRN        09/19/20 0105    dicyclomine (BENTYL) 20 MG tablet  Every 12 hours PRN        09/19/20 0105             Antony Madura, PA-C 09/21/20 0550    Palumbo, April, MD 09/30/20 2306

## 2020-09-18 NOTE — ED Provider Notes (Signed)
Emergency Medicine Provider Triage Evaluation Note  Pamela Macias , a 40 y.o. female  was evaluated in triage.  Pt complains of abdominal pain  Review of Systems  Positive: diarrhea Negative: No vomiting  Physical Exam  BP (!) 151/98   Pulse 79   Temp 98.2 F (36.8 C) (Oral)   Resp 18   SpO2 97%  Gen:   Awake, no distress   Resp:  Normal effort  MSK:   Moves extremities without difficulty  Other:  Abdomen diffusely tender    Medical Decision Making  Medically screening exam initiated at 9:20 PM.  Appropriate orders placed.  Venita Roulhac was informed that the remainder of the evaluation will be completed by another provider, this initial triage assessment does not replace that evaluation, and the importance of remaining in the ED until their evaluation is complete.     Osie Cheeks 09/18/20 2121    Lorre Nick, MD 09/19/20 Ernestina Columbia

## 2020-09-18 NOTE — ED Triage Notes (Signed)
Pt reports sudden onset abdominal pain, diarrhea, and nausea that began about an hour ago after eating. Pt reports hx of gastritis.

## 2020-09-19 MED ORDER — KETOROLAC TROMETHAMINE 15 MG/ML IJ SOLN
15.0000 mg | Freq: Once | INTRAMUSCULAR | Status: AC
  Administered 2020-09-19: 15 mg via INTRAVENOUS
  Filled 2020-09-19: qty 1

## 2020-09-19 MED ORDER — ONDANSETRON 4 MG PO TBDP: 4.0000 mg | ORAL_TABLET | Freq: Three times a day (TID) | ORAL | 0 refills | Status: DC | PRN

## 2020-09-19 MED ORDER — DICYCLOMINE HCL 20 MG PO TABS: 20.0000 mg | ORAL_TABLET | Freq: Two times a day (BID) | ORAL | 0 refills | Status: DC | PRN

## 2020-09-19 NOTE — Discharge Instructions (Addendum)
Avoid fried foods, fatty foods, greasy foods, and milk products until symptoms resolve. Drink plenty of clear liquids. We recommend the use of Zofran as prescribed for nausea/vomiting. Follow-up with your primary care doctor to ensure resolution of symptoms. 

## 2021-05-26 ENCOUNTER — Ambulatory Visit
Admission: EM | Admit: 2021-05-26 | Discharge: 2021-05-26 | Disposition: A | Discharge status: Home / Self Care | Payer: No Typology Code available for payment source | Attending: Internal Medicine | Admitting: Internal Medicine

## 2021-05-26 ENCOUNTER — Other Ambulatory Visit: Payer: Self-pay

## 2021-05-26 DIAGNOSIS — R300 Dysuria: Secondary | ICD-10-CM | POA: Insufficient documentation

## 2021-05-26 DIAGNOSIS — R102 Pelvic and perineal pain: Secondary | ICD-10-CM | POA: Insufficient documentation

## 2021-05-26 LAB — POCT URINALYSIS DIP (MANUAL ENTRY)
Bilirubin, UA: NEGATIVE
Blood, UA: NEGATIVE
Glucose, UA: NEGATIVE mg/dL
Leukocytes, UA: NEGATIVE
Nitrite, UA: NEGATIVE
Protein Ur, POC: NEGATIVE mg/dL
Spec Grav, UA: >=1.03 — AB (ref 1.010–1.025)
Urobilinogen, UA: 0.2 E.U./dL
pH, UA: 6 (ref 5.0–8.0)

## 2021-05-26 LAB — POCT URINE PREGNANCY: Preg Test, Ur: NEGATIVE

## 2021-05-26 NOTE — ED Provider Notes (Signed)
?[name] ? ? ? ?CSN: [id number] ?Arrival date & time: 05/26/21  1605 ? ? ?  ? ?History   ?Chief Complaint ?Chief Complaint  ?Patient presents with  ? Pelvic Pain  ? ? ?HPI ?Pamela Macias is a 41 y.o. female.  ? ?Patient presents with lower abdominal pressure, dysuria, low back pain that has been present since yesterday.  Patient also reports that she is having normal bowel movements with no blood in stool but that she feels abdominal pressure when she poops as well.  Denies any nausea or vomiting.  Denies vaginal discharge, hematuria, irregular vaginal bleeding, pelvic pain.  Last menstrual cycle was 05/09/2021.  Denies any known exposure to STD.  Patient had gastrointestinal surgery a few months prior but has not had any complications. ? ? ?Pelvic Pain ? ? ?Past Medical History:  ?Diagnosis Date  ? Asthma   ? Gastritis   ? Migraine headache   ? ? ?There are no problems to display for this patient. ? ? ?Past Surgical History:  ?Procedure Laterality Date  ? TUBAL LIGATION    ? ? ?OB History   ?No obstetric history on file. ?  ? ? ? ?Home Medications   ? ?Prior to Admission medications   ?Medication Sig Start Date End Date Taking? Authorizing Provider  ?benzonatate (TESSALON) 100 MG capsule Take 1 capsule (100 mg total) by mouth every 8 (eight) hours. ?Patient not taking: Reported on 09/22/2016 05/12/16   Anselm Pancoast, PA-C  ?butalbital-acetaminophen-caffeine (FIORICET, ESGIC) 50-325-40 MG tablet Take 1 tablet by mouth 2 (two) times daily as needed for headache.     [provider]  ?dicyclomine (BENTYL) 20 MG tablet Take 1 tablet (20 mg total) by mouth every 12 (twelve) hours as needed (for abdominal cramping). 09/19/20   Antony Madura, PA-C  ?famotidine (PEPCID) 20 MG tablet Take 1 tablet (20 mg total) by mouth 2 (two) times daily for 7 days. 04/23/18 04/30/18  Bloomfield, Karma Ganja D, DO  ?ibuprofen (ADVIL,MOTRIN) 800 MG tablet Take 1 tablet (800 mg total) by mouth 3 (three) times daily. ?Patient  not taking: Reported on 09/22/2016 05/12/16   Anselm Pancoast, PA-C  ?omeprazole (PRILOSEC) 20 MG capsule Take 1 capsule (20 mg total) by mouth daily. 09/22/16   Roxy Horseman, PA-C  ?ondansetron (ZOFRAN ODT) 4 MG disintegrating tablet Take 1 tablet (4 mg total) by mouth every 8 (eight) hours as needed for nausea or vomiting. 09/19/20   Antony Madura, PA-C  ?predniSONE (STERAPRED UNI-PAK 21 TAB) 10 MG (21) TBPK tablet Take 6 tabs day 1, 5 tabs day 2, 4 tabs day 3, 3 tabs day 4, 2 tabs day 5, and 1 tab on day 6. ?Patient not taking: Reported on 09/22/2016 05/12/16   Anselm Pancoast, PA-C  ?sucralfate (CARAFATE) 1 GM/10ML suspension Take 10 mLs (1 g total) by mouth 4 (four) times daily -  with meals and at bedtime. ?Patient not taking: Reported on 04/23/2018 09/22/16   Roxy Horseman, PA-C  ? ? ?Family History ?No family history on file. ? ?Social History ?Social History  ? ?Tobacco Use  ? Smoking status: Never  ? Smokeless tobacco: Never  ?Substance Use Topics  ? Alcohol use: No  ? Drug use: No  ? ? ? ?Allergies   ?Ancef [cefazolin] ? ? ?Review of Systems ?Review of Systems ?Per HPI ? ?Physical Exam ?Triage Vital Signs ?ED Triage Vitals  ?Enc Vitals Group  ?   BP 05/26/21 1728 109/69  ?   Pulse  Rate 05/26/21 1728 74  ?   Resp 05/26/21 1728 20  ?   Temp 05/26/21 1728 98.1 ?F (36.7 ?C)  ?   Temp Source 05/26/21 1728 Oral  ?   SpO2 05/26/21 1728 100 %  ?   Weight --   ?   Height --   ?   Head Circumference --   ?   Peak Flow --   ?   Pain Score 05/26/21 1729 8  ?   Pain Loc --   ?   Pain Edu? --   ?   Excl. in GC? --   ? ?No data found. ? ?Updated Vital Signs ?BP 109/69 (BP Location: Left Arm)   Pulse 74   Temp 98.1 ?F (36.7 ?C) (Oral)   Resp 20   LMP 05/09/2021 (Exact Date)   SpO2 100%  ? ?Visual Acuity ?Right Eye Distance:   ?Left Eye Distance:   ?Bilateral Distance:   ? ?Right Eye Near:   ?Left Eye Near:    ?Bilateral Near:    ? ?Physical Exam ?Constitutional:   ?   General: She is not in acute distress. ?   Appearance:  Normal appearance. She is not toxic-appearing or diaphoretic.  ?HENT:  ?   Head: Normocephalic and atraumatic.  ?Eyes:  ?   Extraocular Movements: Extraocular movements intact.  ?   Conjunctiva/sclera: Conjunctivae normal.  ?Cardiovascular:  ?   Rate and Rhythm: Normal rate and regular rhythm.  ?   Pulses: Normal pulses.  ?   Heart sounds: Normal heart sounds.  ?Pulmonary:  ?   Effort: Pulmonary effort is normal. No respiratory distress.  ?   Breath sounds: Normal breath sounds.  ?Abdominal:  ?   General: Bowel sounds are normal. There is no distension.  ?   Palpations: Abdomen is soft.  ?   Tenderness: There is abdominal tenderness in the suprapubic area.  ?Genitourinary: ?   Comments: Deferred with shared decision making.  Self swab performed. ?Musculoskeletal:  ?   Cervical back: Normal.  ?   Thoracic back: Normal.  ?   Lumbar back: Normal. No tenderness.  ?Neurological:  ?   General: No focal deficit present.  ?   Mental Status: She is alert and oriented to person, place, and time. Mental status is at baseline.  ?Psychiatric:     ?   Mood and Affect: Mood normal.     ?   Behavior: Behavior normal.     ?   Thought Content: Thought content normal.     ?   Judgment: Judgment normal.  ? ? ? ?UC Treatments / Results  ?Labs ?(all labs ordered are listed, but only abnormal results are displayed) ?Labs Reviewed  ?POCT URINALYSIS DIP (MANUAL ENTRY)  ?POCT URINE PREGNANCY  ? ? ?EKG ? ? ?Radiology ?No results found. ? ?Procedures ?Procedures (including critical care time) ? ?Medications Ordered in UC ?Medications - No data to display ? ?Initial Impression / Assessment and Plan / UC Course  ?I have reviewed the triage vital signs and the nursing notes. ? ?Pertinent labs & imaging results that were available during my care of the patient were reviewed by me and considered in my medical decision making (see chart for details). ? ?  ? ?Urinalysis was not definitive for urinary tract infection.  will send urine culture to  confirm given patient's symptoms.  Cervicovaginal swab pending to rule out this etiology as patient is having suprapubic pressure and dysuria.  Low suspicion  for PID or any complications related to gastrointestinal surgery given physical exam and patient symptoms.  Discussed strict return and ER precautions.  Patient will also need to follow-up with provided contact information for gynecology for further evaluation and management.  Patient is nontoxic appearing and vital signs are stable so do not think the patient is in need of immediate medical attention at the hospital at this time.  Will await results for further treatment.  Patient verbalized understanding and was agreeable with plan. ?Final Clinical Impressions(s) / UC Diagnoses  ? ?Final diagnoses:  ?None  ? ?Discharge Instructions   ?None ?  ? ?ED Prescriptions   ?None ?  ? ?PDMP not reviewed this encounter. ?  ?Gustavus BryantMound, Heman Que E, OregonFNP ?05/26/21 1810 ? ?

## 2021-05-26 NOTE — ED Triage Notes (Signed)
Pt said x 1 day has been having pelvic pain and lower back pain. Pt said does feel some pain with urination. Pt said she feels like she has to poop but hurts when she does. She feels full.  ?

## 2021-05-26 NOTE — Discharge Instructions (Signed)
Your urine did not show signs of urinary tract infection.  Urine culture is pending.  Vaginal swab is pending.  We will call if there are any abnormalities.  Please follow-up at ER if symptoms persist or worsen.  Follow-up with OB/GYN as well for further evaluation and management. ?

## 2021-05-27 LAB — CERVICOVAGINAL ANCILLARY ONLY
Bacterial Vaginitis (gardnerella): NEGATIVE
Candida Glabrata: NEGATIVE
Candida Vaginitis: NEGATIVE
Chlamydia: NEGATIVE
Comment: NEGATIVE
Comment: NEGATIVE
Comment: NEGATIVE
Comment: NEGATIVE
Comment: NEGATIVE
Comment: NORMAL
Neisseria Gonorrhea: NEGATIVE
Trichomonas: NEGATIVE

## 2021-05-28 LAB — URINE CULTURE: Culture: <10000 — AB

## 2021-10-26 ENCOUNTER — Ambulatory Visit: Payer: Self-pay

## 2021-10-26 NOTE — Telephone Encounter (Signed)
Using [facility] Clydie Braun OM#[id number].   Chief Complaint: Urine odor Symptoms: Urine odor, pain at a 7/10, right flank pain Frequency: Onset yesterday Pertinent Negatives: Patient denies fever, other symptoms Disposition: [] ED /[x] Urgent Care (no appt availability in office) / [] Appointment(In office/virtual)/ []  [place] [facility]/ [] Home Care/ [] Refused Recommended Disposition /[] [place] [facility]/ []  Follow-up with PCP Additional Notes: Advised UC due to no PCP, she says when she goes she gets a bill. , she says she is not able to make it today. No NP appointments until next Thursday, she say she will go to the [facility] tomorrow.   Summary: Possible urinary tract infection?   Patient called in stating she feels like she has a urinary tract infection. She states the urine smells horrible. She does not have a primary care doctor and was given the name and numbers of the clinics off of and [place] to contact. Please assist patient further      Reason for Disposition  Bad or foul-smelling urine  Answer Assessment - Initial Assessment Questions 1. SYMPTOM: "What's the main symptom you're concerned about?" (e.g., frequency, incontinence)     Odor 2. ONSET: "When did the symptom start?"     Yesterday 3. PAIN: "Is there any pain?" If Yes, ask: "How bad is it?" (Scale: 1-10; mild, moderate, severe)     7 4. CAUSE: "What do you think is causing the symptoms?"     UTI 5. OTHER SYMPTOMS: "Do you have any other symptoms?" (e.g., blood in urine, fever, flank pain, pain with urination)     Right flank pain, pain with urination 6. PREGNANCY: "Is there any chance you are pregnant?" "When was your last menstrual period?"     No  Protocols used: Urinary Symptoms-A-AH

## 2021-11-08 ENCOUNTER — Encounter: Payer: Self-pay | Admitting: Emergency Medicine

## 2021-11-08 ENCOUNTER — Ambulatory Visit
Admission: EM | Admit: 2021-11-08 | Discharge: 2021-11-08 | Disposition: A | Discharge status: Home / Self Care | Payer: 59 | Attending: Physician Assistant | Admitting: Physician Assistant

## 2021-11-08 ENCOUNTER — Other Ambulatory Visit: Payer: Self-pay

## 2021-11-08 DIAGNOSIS — N3000 Acute cystitis without hematuria: Secondary | ICD-10-CM | POA: Insufficient documentation

## 2021-11-08 LAB — POCT URINALYSIS DIP (MANUAL ENTRY)
Glucose, UA: 100 mg/dL — AB
Nitrite, UA: POSITIVE — AB
Protein Ur, POC: 100 mg/dL — AB
Spec Grav, UA: 1.025 (ref 1.010–1.025)
Urobilinogen, UA: 2 E.U./dL — AB
pH, UA: 5.5 (ref 5.0–8.0)

## 2021-11-08 LAB — POCT URINE PREGNANCY: Preg Test, Ur: NEGATIVE

## 2021-11-08 MED ORDER — NITROFURANTOIN MONOHYD MACRO 100 MG PO CAPS: 100.0000 mg | ORAL_CAPSULE | Freq: Two times a day (BID) | ORAL | 0 refills | Status: DC

## 2021-11-08 NOTE — ED Triage Notes (Signed)
Pt here for dysuria x 1 week; pt sts taking AZO

## 2021-11-08 NOTE — ED Provider Notes (Signed)
[name]    CSN: [id number] Arrival date & time: 11/08/21  0805      History   Chief Complaint Chief Complaint  Patient presents with   Dysuria    HPI Pamela Macias is a 41 y.o. female.   Patient here today for evaluation of dysuria that is been ongoing for 1 week.  She also reports malodorous urine.  She has been taking Azo without resolution of symptoms.  She reports occasional abdominal pain but this is not constant.  She has back pain but reports this is present at baseline and there has been no change.  She does not report fever, nausea or vomiting.  The history is provided by the patient.  Dysuria Associated symptoms: abdominal pain   Associated symptoms: no fever, no nausea and no vomiting     Past Medical History:  Diagnosis Date   Asthma    Gastritis    Migraine headache     There are no problems to display for this patient.   Past Surgical History:  Procedure Laterality Date   TUBAL LIGATION      OB History   No obstetric history on file.      Home Medications    Prior to Admission medications   Medication Sig Start Date End Date Taking? Authorizing Provider  nitrofurantoin, macrocrystal-monohydrate, (MACROBID) 100 MG capsule Take 1 capsule (100 mg total) by mouth 2 (two) times daily. 11/08/21  Yes Tomi Bamberger, PA-C  benzonatate (TESSALON) 100 MG capsule Take 1 capsule (100 mg total) by mouth every 8 (eight) hours. Patient not taking: Reported on 09/22/2016 05/12/16   Joy, Ines Bloomer C, PA-C  butalbital-acetaminophen-caffeine (FIORICET, ESGIC) [phone] MG tablet Take 1 tablet by mouth 2 (two) times daily as needed for headache.     [provider]  dicyclomine (BENTYL) 20 MG tablet Take 1 tablet (20 mg total) by mouth every 12 (twelve) hours as needed (for abdominal cramping). 09/19/20   Antony Madura, PA-C  famotidine (PEPCID) 20 MG tablet Take 1 tablet (20 mg total) by mouth 2 (two) times daily for 7 days. 04/23/18 04/30/18   Bloomfield, Carley D, DO  ibuprofen (ADVIL,MOTRIN) 800 MG tablet Take 1 tablet (800 mg total) by mouth 3 (three) times daily. Patient not taking: Reported on 09/22/2016 05/12/16   Anselm Pancoast, PA-C  omeprazole (PRILOSEC) 20 MG capsule Take 1 capsule (20 mg total) by mouth daily. 09/22/16   Roxy Horseman, PA-C  ondansetron (ZOFRAN ODT) 4 MG disintegrating tablet Take 1 tablet (4 mg total) by mouth every 8 (eight) hours as needed for nausea or vomiting. 09/19/20   Antony Madura, PA-C  predniSONE (STERAPRED UNI-PAK 21 TAB) 10 MG (21) TBPK tablet Take 6 tabs day 1, 5 tabs day 2, 4 tabs day 3, 3 tabs day 4, 2 tabs day 5, and 1 tab on day 6. Patient not taking: Reported on 09/22/2016 05/12/16   Harolyn Rutherford C, PA-C  sucralfate (CARAFATE) 1 GM/10ML suspension Take 10 mLs (1 g total) by mouth 4 (four) times daily -  with meals and at bedtime. Patient not taking: Reported on 04/23/2018 09/22/16   Roxy Horseman, PA-C    Family History History reviewed. No pertinent family history.  Social History Social History   Tobacco Use   Smoking status: Never   Smokeless tobacco: Never  Substance Use Topics   Alcohol use: No   Drug use: No     Allergies   Ancef [cefazolin]   Review of Systems Review  of Systems  Constitutional:  Negative for chills and fever.  Respiratory:  Negative for shortness of breath.   Cardiovascular:  Negative for chest pain.  Gastrointestinal:  Positive for abdominal pain. Negative for nausea and vomiting.  Genitourinary:  Positive for dysuria and frequency.  Musculoskeletal:  Positive for back pain.     Physical Exam Triage Vital Signs ED Triage Vitals  Enc Vitals Group     BP 11/08/21 0831 113/70     Pulse Rate 11/08/21 0831 93     Resp 11/08/21 0831 18     Temp 11/08/21 0831 98 F (36.7 C)     Temp Source 11/08/21 0831 Oral     SpO2 11/08/21 0831 98 %     Weight --      Height --      Head Circumference --      Peak Flow --      Pain Score 11/08/21 0832 5      Pain Loc --      Pain Edu? --      Excl. in GC? --    No data found.  Updated Vital Signs BP 113/70 (BP Location: Left Arm)   Pulse 93   Temp 98 F (36.7 C) (Oral)   Resp 18   SpO2 98%      Physical Exam Vitals and nursing note reviewed.  Constitutional:      General: She is not in acute distress.    Appearance: Normal appearance. She is not ill-appearing.  HENT:     Head: Normocephalic and atraumatic.     Nose: Nose normal.  Cardiovascular:     Rate and Rhythm: Normal rate.  Pulmonary:     Effort: Pulmonary effort is normal. No respiratory distress.  Abdominal:     General: Abdomen is flat.  Skin:    General: Skin is warm and dry.  Neurological:     Mental Status: She is alert.  Psychiatric:        Mood and Affect: Mood normal.        Thought Content: Thought content normal.      UC Treatments / Results  Labs (all labs ordered are listed, but only abnormal results are displayed) Labs Reviewed  POCT URINALYSIS DIP (MANUAL ENTRY) - Abnormal; Notable for the following components:      Result Value   Color, UA orange (*)    Clarity, UA cloudy (*)    Glucose, UA =100 (*)    Bilirubin, UA small (*)    Ketones, POC UA trace (5) (*)    Blood, UA moderate (*)    Protein Ur, POC =100 (*)    Urobilinogen, UA 2.0 (*)    Nitrite, UA Positive (*)    Leukocytes, UA Large (3+) (*)    All other components within normal limits  URINE CULTURE  POCT URINE PREGNANCY    EKG   Radiology No results found.  Procedures Procedures (including critical care time)  Medications Ordered in UC Medications - No data to display  Initial Impression / Assessment and Plan / UC Course  I have reviewed the triage vital signs and the nursing notes.  Pertinent labs & imaging results that were available during my care of the patient were reviewed by me and considered in my medical decision making (see chart for details).    Antibiotic prescribed to cover probable UTI.  Urine  culture ordered.  Recommended follow-up if symptoms do not improve or worsen.  Final Clinical Impressions(s) /  UC Diagnoses   Final diagnoses:  Acute cystitis without hematuria   Discharge Instructions   None    ED Prescriptions     Medication Sig Dispense Auth. Provider   nitrofurantoin, macrocrystal-monohydrate, (MACROBID) 100 MG capsule Take 1 capsule (100 mg total) by mouth 2 (two) times daily. 10 capsule Francene Finders, PA-C      PDMP not reviewed this encounter.   Francene Finders, PA-C 11/08/21 [phone]

## 2021-11-10 LAB — URINE CULTURE: Culture: >=100000 — AB

## 2021-11-22 ENCOUNTER — Ambulatory Visit
Admission: EM | Admit: 2021-11-22 | Discharge: 2021-11-22 | Disposition: A | Discharge status: Home / Self Care | Payer: 59 | Attending: Physician Assistant | Admitting: Physician Assistant

## 2021-11-22 ENCOUNTER — Encounter: Payer: Self-pay | Admitting: Physician Assistant

## 2021-11-22 DIAGNOSIS — N3000 Acute cystitis without hematuria: Secondary | ICD-10-CM | POA: Insufficient documentation

## 2021-11-22 LAB — POCT URINALYSIS DIP (MANUAL ENTRY)
Bilirubin, UA: NEGATIVE
Blood, UA: NEGATIVE
Glucose, UA: NEGATIVE mg/dL
Ketones, POC UA: NEGATIVE mg/dL
Leukocytes, UA: NEGATIVE
Nitrite, UA: POSITIVE — AB
Protein Ur, POC: NEGATIVE mg/dL
Spec Grav, UA: 1.02 (ref 1.010–1.025)
Urobilinogen, UA: 1 E.U./dL
pH, UA: 6.5 (ref 5.0–8.0)

## 2021-11-22 LAB — POCT URINE PREGNANCY: Preg Test, Ur: NEGATIVE

## 2021-11-22 MED ORDER — NITROFURANTOIN MONOHYD MACRO 100 MG PO CAPS: 100.0000 mg | ORAL_CAPSULE | Freq: Two times a day (BID) | ORAL | 0 refills | Status: DC

## 2021-11-22 NOTE — ED Triage Notes (Signed)
Pt presents to uc with co of dysuria and low abd pain for one week, pt reports recent uti treated with cipro last month but symptoms returned.

## 2021-11-22 NOTE — ED Provider Notes (Signed)
Pamela Macias    CSN: [id number] Arrival date & time: 11/22/21  1027      History   Chief Complaint Chief Complaint  Patient presents with   Dysuria   Abdominal Pain    HPI Pamela Macias is a 41 y.o. female.   Patient here today for evaluation of dysuria, lower abdominal pain for 1 week.  She reports that she did have a UTI last month but symptoms have returned.  She notes some flank pain on the right.  She denies any fever but has had some chills.  She also reports some nausea but no vomiting.  She has tried Tylenol with mild relief of symptoms.  The history is provided by the patient.  Dysuria Associated symptoms: abdominal pain and flank pain   Associated symptoms: no fever, no nausea and no vomiting   Abdominal Pain Associated symptoms: dysuria   Associated symptoms: no chest pain, no chills, no fever, no nausea, no shortness of breath and no vomiting     Past Medical History:  Diagnosis Date   Asthma    Gastritis    Migraine headache     There are no problems to display for this patient.   Past Surgical History:  Procedure Laterality Date   TUBAL LIGATION      OB History   No obstetric history on file.      Home Medications    Prior to Admission medications   Medication Sig Start Date End Date Taking? Authorizing Provider  nitrofurantoin, macrocrystal-monohydrate, (MACROBID) 100 MG capsule Take 1 capsule (100 mg total) by mouth 2 (two) times daily. 11/22/21  Yes Tomi Bamberger, PA-C  benzonatate (TESSALON) 100 MG capsule Take 1 capsule (100 mg total) by mouth every 8 (eight) hours. Patient not taking: Reported on 09/22/2016 05/12/16   Joy, Ines Bloomer C, PA-C  butalbital-acetaminophen-caffeine (FIORICET, ESGIC) [phone] MG tablet Take 1 tablet by mouth 2 (two) times daily as needed for headache.     [provider]  dicyclomine (BENTYL) 20 MG tablet Take 1 tablet (20 mg total) by mouth every 12 (twelve) hours as needed (for  abdominal cramping). 09/19/20   Antony Madura, PA-C  famotidine (PEPCID) 20 MG tablet Take 1 tablet (20 mg total) by mouth 2 (two) times daily for 7 days. 04/23/18 04/30/18  Bloomfield, Carley D, DO  ibuprofen (ADVIL,MOTRIN) 800 MG tablet Take 1 tablet (800 mg total) by mouth 3 (three) times daily. Patient not taking: Reported on 09/22/2016 05/12/16   Anselm Pancoast, PA-C  omeprazole (PRILOSEC) 20 MG capsule Take 1 capsule (20 mg total) by mouth daily. 09/22/16   Roxy Horseman, PA-C  ondansetron (ZOFRAN ODT) 4 MG disintegrating tablet Take 1 tablet (4 mg total) by mouth every 8 (eight) hours as needed for nausea or vomiting. 09/19/20   Antony Madura, PA-C  predniSONE (STERAPRED UNI-PAK 21 TAB) 10 MG (21) TBPK tablet Take 6 tabs day 1, 5 tabs day 2, 4 tabs day 3, 3 tabs day 4, 2 tabs day 5, and 1 tab on day 6. Patient not taking: Reported on 09/22/2016 05/12/16   Harolyn Rutherford C, PA-C  sucralfate (CARAFATE) 1 GM/10ML suspension Take 10 mLs (1 g total) by mouth 4 (four) times daily -  with meals and at bedtime. Patient not taking: Reported on 04/23/2018 09/22/16   Roxy Horseman, PA-C    Family History History reviewed. No pertinent family history.  Social History Social History   Tobacco Use   Smoking status: Never  Smokeless tobacco: Never  Substance Use Topics   Alcohol use: No   Drug use: No     Allergies   Ancef [cefazolin]   Review of Systems Review of Systems  Constitutional:  Negative for chills and fever.  Respiratory:  Negative for shortness of breath.   Cardiovascular:  Negative for chest pain.  Gastrointestinal:  Positive for abdominal pain. Negative for nausea and vomiting.  Genitourinary:  Positive for dysuria and flank pain.  Musculoskeletal:  Positive for back pain.     Physical Exam Triage Vital Signs ED Triage Vitals  Enc Vitals Group     BP 11/22/21 1133 115/72     Pulse Rate 11/22/21 1133 65     Resp 11/22/21 1133 20     Temp 11/22/21 1133 97.8 F (36.6 C)      Temp src --      SpO2 11/22/21 1133 98 %     Weight --      Height --      Head Circumference --      Peak Flow --      Pain Score 11/22/21 1132 4     Pain Loc --      Pain Edu? --      Excl. in GC? --    No data found.  Updated Vital Signs BP 115/72   Pulse 65   Temp 97.8 F (36.6 C)   Resp 20   SpO2 98%      Physical Exam Vitals and nursing note reviewed.  Constitutional:      General: She is not in acute distress.    Appearance: Normal appearance. She is not ill-appearing.  HENT:     Head: Normocephalic and atraumatic.     Nose: Nose normal.  Cardiovascular:     Rate and Rhythm: Normal rate.  Pulmonary:     Effort: Pulmonary effort is normal. No respiratory distress.  Skin:    General: Skin is warm and dry.  Neurological:     Mental Status: She is alert.  Psychiatric:        Mood and Affect: Mood normal.        Thought Content: Thought content normal.      UC Treatments / Results  Labs (all labs ordered are listed, but only abnormal results are displayed) Labs Reviewed  POCT URINALYSIS DIP (MANUAL ENTRY) - Abnormal; Notable for the following components:      Result Value   Color, UA orange (*)    Nitrite, UA Positive (*)    All other components within normal limits  URINE CULTURE  POCT URINE PREGNANCY    EKG   Radiology No results found.  Procedures Procedures (including critical Macias time)  Medications Ordered in UC Medications - No data to display  Initial Impression / Assessment and Plan / UC Course  I have reviewed the triage vital signs and the nursing notes.  Pertinent labs & imaging results that were available during my Macias of the patient were reviewed by me and considered in my medical decision making (see chart for details).   Macrobid prescribed to cover UTI.  Patient reports she has done well with Cipro in the past and recommend switch to Cipro in the event that symptoms or not improving or culture comes back resistant.   Discussed Cipro has greater side effect profile.  Patient expresses understanding.  Encouraged follow-up with any further concerns.   Final Clinical Impressions(s) / UC Diagnoses   Final diagnoses:  Acute  cystitis without hematuria   Discharge Instructions   None    ED Prescriptions     Medication Sig Dispense Auth. Provider   nitrofurantoin, macrocrystal-monohydrate, (MACROBID) 100 MG capsule Take 1 capsule (100 mg total) by mouth 2 (two) times daily. 10 capsule Tomi Bamberger, PA-C      PDMP not reviewed this encounter.   Tomi Bamberger, PA-C 11/22/21 1155

## 2021-11-24 LAB — URINE CULTURE: Culture: >=100000 — AB

## 2021-11-25 ENCOUNTER — Encounter: Payer: Self-pay | Admitting: Emergency Medicine

## 2021-11-25 ENCOUNTER — Ambulatory Visit
Admission: EM | Admit: 2021-11-25 | Discharge: 2021-11-25 | Disposition: A | Discharge status: Home / Self Care | Payer: 59 | Attending: Physician Assistant | Admitting: Physician Assistant

## 2021-11-25 DIAGNOSIS — N3000 Acute cystitis without hematuria: Secondary | ICD-10-CM | POA: Diagnosis not present

## 2021-11-25 MED ORDER — CIPROFLOXACIN HCL 500 MG PO TABS: 500.0000 mg | ORAL_TABLET | Freq: Two times a day (BID) | ORAL | 0 refills | Status: DC

## 2021-11-25 NOTE — ED Triage Notes (Signed)
Pt presents to uc with co of urinary tract infection not getting better following antibiotic treatment with Macrobid. Pt endorses dysuria, chills, and ha.

## 2021-11-25 NOTE — ED Provider Notes (Signed)
[name]    CSN: [id number] Arrival date & time: 11/25/21  0806      History   Chief Complaint Chief Complaint  Patient presents with   Dysuria    HPI Pamela Macias is a 41 y.o. female.   Patient here today for evaluation of continued UTI symptoms. She reports that despite taking macrobid she continues to have dysuria, chills and headache. She has not had fever that she is aware of. She has not had nausea or vomiting.   The history is provided by the patient.    Past Medical History:  Diagnosis Date   Asthma    Gastritis    Migraine headache     There are no problems to display for this patient.   Past Surgical History:  Procedure Laterality Date   TUBAL LIGATION      OB History   No obstetric history on file.      Home Medications    Prior to Admission medications   Medication Sig Start Date End Date Taking? Authorizing Provider  benzonatate (TESSALON) 100 MG capsule Take 1 capsule (100 mg total) by mouth every 8 (eight) hours. Patient not taking: Reported on 09/22/2016 05/12/16   Joy, Ines Bloomer C, PA-C  butalbital-acetaminophen-caffeine (FIORICET, ESGIC) [phone] MG tablet Take 1 tablet by mouth 2 (two) times daily as needed for headache.     [provider]  ciprofloxacin (CIPRO) 500 MG tablet Take 1 tablet (500 mg total) by mouth every 12 (twelve) hours. 11/25/21   Tomi Bamberger, PA-C  dicyclomine (BENTYL) 20 MG tablet Take 1 tablet (20 mg total) by mouth every 12 (twelve) hours as needed (for abdominal cramping). 09/19/20   Antony Madura, PA-C  famotidine (PEPCID) 20 MG tablet Take 1 tablet (20 mg total) by mouth 2 (two) times daily for 7 days. 04/23/18 04/30/18  Bloomfield, Carley D, DO  ibuprofen (ADVIL,MOTRIN) 800 MG tablet Take 1 tablet (800 mg total) by mouth 3 (three) times daily. Patient not taking: Reported on 09/22/2016 05/12/16   Anselm Pancoast, PA-C  nitrofurantoin, macrocrystal-monohydrate, (MACROBID) 100 MG capsule Take 1  capsule (100 mg total) by mouth 2 (two) times daily. 11/22/21   Tomi Bamberger, PA-C  omeprazole (PRILOSEC) 20 MG capsule Take 1 capsule (20 mg total) by mouth daily. 09/22/16   Roxy Horseman, PA-C  ondansetron (ZOFRAN ODT) 4 MG disintegrating tablet Take 1 tablet (4 mg total) by mouth every 8 (eight) hours as needed for nausea or vomiting. 09/19/20   Antony Madura, PA-C  predniSONE (STERAPRED UNI-PAK 21 TAB) 10 MG (21) TBPK tablet Take 6 tabs day 1, 5 tabs day 2, 4 tabs day 3, 3 tabs day 4, 2 tabs day 5, and 1 tab on day 6. Patient not taking: Reported on 09/22/2016 05/12/16   Harolyn Rutherford C, PA-C  sucralfate (CARAFATE) 1 GM/10ML suspension Take 10 mLs (1 g total) by mouth 4 (four) times daily -  with meals and at bedtime. Patient not taking: Reported on 04/23/2018 09/22/16   Roxy Horseman, PA-C    Family History History reviewed. No pertinent family history.  Social History Social History   Tobacco Use   Smoking status: Never   Smokeless tobacco: Never  Substance Use Topics   Alcohol use: No   Drug use: No     Allergies   Ancef [cefazolin]   Review of Systems Review of Systems  Constitutional:  Positive for chills. Negative for fever.  Respiratory:  Negative for shortness of breath.  Gastrointestinal:  Negative for abdominal pain, nausea and vomiting.  Genitourinary:  Positive for dysuria.  Musculoskeletal:  Positive for back pain.     Physical Exam Triage Vital Signs ED Triage Vitals  Enc Vitals Group     BP      Pulse      Resp      Temp      Temp src      SpO2      Weight      Height      Head Circumference      Peak Flow      Pain Score      Pain Loc      Pain Edu?      Excl. in GC?    No data found.  Updated Vital Signs BP 105/67   Pulse 69   Temp 97.9 F (36.6 C)   Resp 19   SpO2 99%   Physical Exam Vitals and nursing note reviewed.  Constitutional:      General: She is not in acute distress.    Appearance: Normal appearance. She is not  ill-appearing.  HENT:     Head: Normocephalic and atraumatic.     Nose: Nose normal.  Cardiovascular:     Rate and Rhythm: Normal rate and regular rhythm.     Heart sounds: Normal heart sounds. No murmur heard. Pulmonary:     Effort: Pulmonary effort is normal. No respiratory distress.     Breath sounds: Normal breath sounds. No wheezing, rhonchi or rales.  Abdominal:     General: Abdomen is flat. Bowel sounds are normal. There is no distension.     Palpations: Abdomen is soft.     Tenderness: There is no abdominal tenderness. There is no right CVA tenderness, left CVA tenderness or guarding.  Skin:    General: Skin is warm and dry.  Neurological:     Mental Status: She is alert.  Psychiatric:        Mood and Affect: Mood normal.        Thought Content: Thought content normal.      UC Treatments / Results  Labs (all labs ordered are listed, but only abnormal results are displayed) Labs Reviewed - No data to display  EKG   Radiology No results found.  Procedures Procedures (including critical care time)  Medications Ordered in UC Medications - No data to display  Initial Impression / Assessment and Plan / UC Course  I have reviewed the triage vital signs and the nursing notes.  Pertinent labs & imaging results that were available during my care of the patient were reviewed by me and considered in my medical decision making (see chart for details).   Cipro prescribed as this has worked well for her in the past. Encouraged follow up with any further concerns.    Final Clinical Impressions(s) / UC Diagnoses   Final diagnoses:  Acute cystitis without hematuria   Discharge Instructions   None    ED Prescriptions     Medication Sig Dispense Auth. Provider   ciprofloxacin (CIPRO) 500 MG tablet  (Status: Discontinued) Take 1 tablet (500 mg total) by mouth every 12 (twelve) hours. 10 tablet Erma Pinto F, PA-C   ciprofloxacin (CIPRO) 500 MG tablet Take 1 tablet  (500 mg total) by mouth every 12 (twelve) hours. 10 tablet Tomi Bamberger, PA-C      PDMP not reviewed this encounter.   Tomi Bamberger, PA-C 11/25/21 1051

## 2022-02-01 ENCOUNTER — Ambulatory Visit
Admission: EM | Admit: 2022-02-01 | Discharge: 2022-02-01 | Disposition: A | Discharge status: Home / Self Care | Payer: 59 | Attending: Internal Medicine | Admitting: Internal Medicine

## 2022-02-01 DIAGNOSIS — R350 Frequency of micturition: Secondary | ICD-10-CM | POA: Diagnosis not present

## 2022-02-01 DIAGNOSIS — N898 Other specified noninflammatory disorders of vagina: Secondary | ICD-10-CM | POA: Insufficient documentation

## 2022-02-01 DIAGNOSIS — R300 Dysuria: Secondary | ICD-10-CM | POA: Diagnosis present

## 2022-02-01 LAB — POCT URINALYSIS DIP (MANUAL ENTRY)
Bilirubin, UA: NEGATIVE
Blood, UA: NEGATIVE
Glucose, UA: NEGATIVE mg/dL
Ketones, POC UA: NEGATIVE mg/dL
Leukocytes, UA: NEGATIVE
Nitrite, UA: NEGATIVE
Protein Ur, POC: NEGATIVE mg/dL
Spec Grav, UA: 1.015 (ref 1.010–1.025)
Urobilinogen, UA: 0.2 E.U./dL
pH, UA: 6.5 (ref 5.0–8.0)

## 2022-02-01 NOTE — ED Triage Notes (Signed)
Pt c/o dysuria, malodor, burning sensation. Onset ~ 2-3 days ago. Has taken azo. Says she has had 3-4 UTIs this year.

## 2022-02-01 NOTE — ED Provider Notes (Signed)
[name]    CSN: [id number] Arrival date & time: 02/01/22  1634      History   Chief Complaint Chief Complaint  Patient presents with   Dysuria    HPI Keltie Shellene Sweigert is a 41 y.o. female.   Patient presents with dysuria, urinary frequency, malodorous urine that started about 2 to 3 days ago.  Patient also reports clear vaginal discharge but states this is baseline for her and there has been no change.  Denies hematuria, abdominal pain, back pain, fever.  Denies any concern or exposure to STD.  Patient has been seen multiple times at urgent care for urinary tract infections and reports that she gets multiple UTIs every year.  She has never been seen by urology.  She has been seen by gynecology for baseline vaginal discharge and reports that they were not able to find any abnormalities. Has taken azo for symptoms.    Dysuria   Past Medical History:  Diagnosis Date   Asthma    Gastritis    Migraine headache     There are no problems to display for this patient.   Past Surgical History:  Procedure Laterality Date   TUBAL LIGATION      OB History   No obstetric history on file.      Home Medications    Prior to Admission medications   Medication Sig Start Date End Date Taking? Authorizing Provider  benzonatate (TESSALON) 100 MG capsule Take 1 capsule (100 mg total) by mouth every 8 (eight) hours. Patient not taking: Reported on 09/22/2016 05/12/16   Joy, Ines Bloomer C, PA-C  butalbital-acetaminophen-caffeine (FIORICET, ESGIC) [phone] MG tablet Take 1 tablet by mouth 2 (two) times daily as needed for headache.     [provider]  ciprofloxacin (CIPRO) 500 MG tablet Take 1 tablet (500 mg total) by mouth every 12 (twelve) hours. 11/25/21   Tomi Bamberger, PA-C  dicyclomine (BENTYL) 20 MG tablet Take 1 tablet (20 mg total) by mouth every 12 (twelve) hours as needed (for abdominal cramping). 09/19/20   Antony Madura, PA-C  famotidine (PEPCID) 20 MG  tablet Take 1 tablet (20 mg total) by mouth 2 (two) times daily for 7 days. 04/23/18 04/30/18  Bloomfield, Carley D, DO  ibuprofen (ADVIL,MOTRIN) 800 MG tablet Take 1 tablet (800 mg total) by mouth 3 (three) times daily. Patient not taking: Reported on 09/22/2016 05/12/16   Anselm Pancoast, PA-C  nitrofurantoin, macrocrystal-monohydrate, (MACROBID) 100 MG capsule Take 1 capsule (100 mg total) by mouth 2 (two) times daily. 11/22/21   Tomi Bamberger, PA-C  omeprazole (PRILOSEC) 20 MG capsule Take 1 capsule (20 mg total) by mouth daily. 09/22/16   Roxy Horseman, PA-C  ondansetron (ZOFRAN ODT) 4 MG disintegrating tablet Take 1 tablet (4 mg total) by mouth every 8 (eight) hours as needed for nausea or vomiting. 09/19/20   Antony Madura, PA-C  predniSONE (STERAPRED UNI-PAK 21 TAB) 10 MG (21) TBPK tablet Take 6 tabs day 1, 5 tabs day 2, 4 tabs day 3, 3 tabs day 4, 2 tabs day 5, and 1 tab on day 6. Patient not taking: Reported on 09/22/2016 05/12/16   Harolyn Rutherford C, PA-C  sucralfate (CARAFATE) 1 GM/10ML suspension Take 10 mLs (1 g total) by mouth 4 (four) times daily -  with meals and at bedtime. Patient not taking: Reported on 04/23/2018 09/22/16   Roxy Horseman, PA-C    Family History History reviewed. No pertinent family history.  Social History  Social History   Tobacco Use   Smoking status: Never   Smokeless tobacco: Never  Substance Use Topics   Alcohol use: No   Drug use: No     Allergies   Ancef [cefazolin]   Review of Systems Review of Systems Per HPI  Physical Exam Triage Vital Signs ED Triage Vitals [02/01/22 1810]  Enc Vitals Group     BP 114/76     Pulse Rate 74     Resp 16     Temp 98 F (36.7 C)     Temp Source Oral     SpO2 98 %     Weight      Height      Head Circumference      Peak Flow      Pain Score 0     Pain Loc      Pain Edu?      Excl. in GC?    No data found.  Updated Vital Signs BP 114/76 (BP Location: Left Arm)   Pulse 74   Temp 98 F (36.7 C)  (Oral)   Resp 16   SpO2 98%   Visual Acuity Right Eye Distance:   Left Eye Distance:   Bilateral Distance:    Right Eye Near:   Left Eye Near:    Bilateral Near:     Physical Exam Constitutional:      General: She is not in acute distress.    Appearance: Normal appearance. She is not toxic-appearing or diaphoretic.  HENT:     Head: Normocephalic and atraumatic.  Eyes:     Extraocular Movements: Extraocular movements intact.     Conjunctiva/sclera: Conjunctivae normal.  Cardiovascular:     Rate and Rhythm: Normal rate and regular rhythm.     Pulses: Normal pulses.     Heart sounds: Normal heart sounds.  Pulmonary:     Effort: Pulmonary effort is normal. No respiratory distress.     Breath sounds: Normal breath sounds.  Abdominal:     General: Bowel sounds are normal. There is no distension.     Palpations: Abdomen is soft.     Tenderness: There is no abdominal tenderness.  Genitourinary:    Comments: Deferred with shared decision making. Self swab performed. Neurological:     General: No focal deficit present.     Mental Status: She is alert and oriented to person, place, and time. Mental status is at baseline.  Psychiatric:        Mood and Affect: Mood normal.        Behavior: Behavior normal.        Thought Content: Thought content normal.        Judgment: Judgment normal.      UC Treatments / Results  Labs (all labs ordered are listed, but only abnormal results are displayed) Labs Reviewed  POCT URINALYSIS DIP (MANUAL ENTRY)  CERVICOVAGINAL ANCILLARY ONLY    EKG   Radiology No results found.  Procedures Procedures (including critical care time)  Medications Ordered in UC Medications - No data to display  Initial Impression / Assessment and Plan / UC Course  I have reviewed the triage vital signs and the nursing notes.  Pertinent labs & imaging results that were available during my care of the patient were reviewed by me and considered in my  medical decision making (see chart for details).     Urinalysis completely unremarkable for infection.  Unsure exact etiology of patient's dysuria.   Will send  cervicovaginal swab to ensure that vaginitis is not cause of symptoms.  Patient denied concern for STDs so will test for BV and yeast.  Given recurrent UTIs, patient was encouraged to follow-up with provided contact information for urology.  PCP appointment also set up for patient prior to discharge by clinical staff as well.  Patient was advised to follow-up if symptoms persist or worsen.  Patient verbalized understanding and was agreeable with plan. Final Clinical Impressions(s) / UC Diagnoses   Final diagnoses:  Dysuria  Urinary frequency  Vaginal discharge     Discharge Instructions      Your urine was completely normal.  Your vaginal swab is pending.  We will call if it is abnormal.  I recommend that you follow-up with your primary care doctor and urologist at provided contact information.     ED Prescriptions   None    PDMP not reviewed this encounter.   Gustavus Bryant, [place] 02/01/22 [phone]

## 2022-02-01 NOTE — Discharge Instructions (Signed)
Your urine was completely normal.  Your vaginal swab is pending.  We will call if it is abnormal.  I recommend that you follow-up with your primary care doctor and urologist at provided contact information.

## 2022-02-04 ENCOUNTER — Ambulatory Visit
Admission: EM | Admit: 2022-02-04 | Discharge: 2022-02-04 | Disposition: A | Discharge status: Home / Self Care | Payer: 59 | Attending: Physician Assistant | Admitting: Physician Assistant

## 2022-02-04 DIAGNOSIS — N3000 Acute cystitis without hematuria: Secondary | ICD-10-CM | POA: Diagnosis present

## 2022-02-04 LAB — POCT URINALYSIS DIP (MANUAL ENTRY)
Bilirubin, UA: NEGATIVE
Blood, UA: NEGATIVE
Glucose, UA: NEGATIVE mg/dL
Ketones, POC UA: NEGATIVE mg/dL
Nitrite, UA: POSITIVE — AB
Protein Ur, POC: NEGATIVE mg/dL
Spec Grav, UA: 1.02 (ref 1.010–1.025)
Urobilinogen, UA: 0.2 E.U./dL
pH, UA: 7 (ref 5.0–8.0)

## 2022-02-04 LAB — POCT URINE PREGNANCY: Preg Test, Ur: NEGATIVE

## 2022-02-04 MED ORDER — NITROFURANTOIN MONOHYD MACRO 100 MG PO CAPS: 100.0000 mg | ORAL_CAPSULE | Freq: Two times a day (BID) | ORAL | 0 refills | Status: DC

## 2022-02-04 MED ORDER — CIPROFLOXACIN HCL 500 MG PO TABS: 500.0000 mg | ORAL_TABLET | Freq: Two times a day (BID) | ORAL | 0 refills | Status: DC

## 2022-02-04 NOTE — ED Provider Notes (Signed)
[name]    CSN: [id number] Arrival date & time: 02/04/22  1419      History   Chief Complaint Chief Complaint  Patient presents with   UTI    HPI Pamela Macias is a 41 y.o. female.   Here today for evaluation of dysuria, bilateral flank pain, lower abdominal pain and foul-smelling urine for 4 days.  She reports that symptoms are similar to prior UTI.  She denies any fever.  She does note overall she does not feel well.  She does not report treatment for symptoms.  The history is provided by the patient.    Past Medical History:  Diagnosis Date   Asthma    Gastritis    Migraine headache     There are no problems to display for this patient.   Past Surgical History:  Procedure Laterality Date   TUBAL LIGATION      OB History   No obstetric history on file.      Home Medications    Prior to Admission medications   Medication Sig Start Date End Date Taking? Authorizing Provider  ciprofloxacin (CIPRO) 500 MG tablet Take 1 tablet (500 mg total) by mouth every 12 (twelve) hours. 02/04/22  Yes Tomi Bamberger, PA-C  nitrofurantoin, macrocrystal-monohydrate, (MACROBID) 100 MG capsule Take 1 capsule (100 mg total) by mouth 2 (two) times daily. 02/04/22  Yes Tomi Bamberger, PA-C  benzonatate (TESSALON) 100 MG capsule Take 1 capsule (100 mg total) by mouth every 8 (eight) hours. Patient not taking: Reported on 09/22/2016 05/12/16   Joy, Ines Bloomer C, PA-C  butalbital-acetaminophen-caffeine (FIORICET, ESGIC) [phone] MG tablet Take 1 tablet by mouth 2 (two) times daily as needed for headache.     [provider]  dicyclomine (BENTYL) 20 MG tablet Take 1 tablet (20 mg total) by mouth every 12 (twelve) hours as needed (for abdominal cramping). 09/19/20   Antony Madura, PA-C  famotidine (PEPCID) 20 MG tablet Take 1 tablet (20 mg total) by mouth 2 (two) times daily for 7 days. 04/23/18 04/30/18  Bloomfield, Carley D, DO  ibuprofen (ADVIL,MOTRIN) 800 MG  tablet Take 1 tablet (800 mg total) by mouth 3 (three) times daily. Patient not taking: Reported on 09/22/2016 05/12/16   Anselm Pancoast, PA-C  omeprazole (PRILOSEC) 20 MG capsule Take 1 capsule (20 mg total) by mouth daily. 09/22/16   Roxy Horseman, PA-C  ondansetron (ZOFRAN ODT) 4 MG disintegrating tablet Take 1 tablet (4 mg total) by mouth every 8 (eight) hours as needed for nausea or vomiting. 09/19/20   Antony Madura, PA-C  predniSONE (STERAPRED UNI-PAK 21 TAB) 10 MG (21) TBPK tablet Take 6 tabs day 1, 5 tabs day 2, 4 tabs day 3, 3 tabs day 4, 2 tabs day 5, and 1 tab on day 6. Patient not taking: Reported on 09/22/2016 05/12/16   Harolyn Rutherford C, PA-C  sucralfate (CARAFATE) 1 GM/10ML suspension Take 10 mLs (1 g total) by mouth 4 (four) times daily -  with meals and at bedtime. Patient not taking: Reported on 04/23/2018 09/22/16   Roxy Horseman, PA-C    Family History History reviewed. No pertinent family history.  Social History Social History   Tobacco Use   Smoking status: Never   Smokeless tobacco: Never  Substance Use Topics   Alcohol use: No   Drug use: No     Allergies   Ancef [cefazolin]   Review of Systems Review of Systems  Constitutional:  Positive for fatigue. Negative  for chills and fever.  Eyes:  Negative for discharge and redness.  Respiratory:  Negative for shortness of breath.   Gastrointestinal:  Negative for abdominal pain, nausea and vomiting.  Genitourinary:  Positive for dysuria.     Physical Exam Triage Vital Signs ED Triage Vitals [02/04/22 1450]  Enc Vitals Group     BP      Pulse      Resp      Temp      Temp src      SpO2      Weight      Height      Head Circumference      Peak Flow      Pain Score 7     Pain Loc      Pain Edu?      Excl. in GC?    No data found.  Updated Vital Signs BP 112/70 (BP Location: Left Arm)   Pulse 80   Temp 98 F (36.7 C) (Oral)   Resp 17   LMP 01/23/2022   SpO2 98%     Physical Exam Vitals and  nursing note reviewed.  Constitutional:      General: She is not in acute distress.    Appearance: Normal appearance. She is not ill-appearing.  HENT:     Head: Normocephalic and atraumatic.  Eyes:     Conjunctiva/sclera: Conjunctivae normal.  Cardiovascular:     Rate and Rhythm: Normal rate.  Pulmonary:     Effort: Pulmonary effort is normal.  Neurological:     Mental Status: She is alert.  Psychiatric:        Mood and Affect: Mood normal.        Behavior: Behavior normal.        Thought Content: Thought content normal.      UC Treatments / Results  Labs (all labs ordered are listed, but only abnormal results are displayed) Labs Reviewed  POCT URINALYSIS DIP (MANUAL ENTRY) - Abnormal; Notable for the following components:      Result Value   Nitrite, UA Positive (*)    Leukocytes, UA Small (1+) (*)    All other components within normal limits  URINE CULTURE  POCT URINE PREGNANCY    EKG   Radiology No results found.  Procedures Procedures (including critical care time)  Medications Ordered in UC Medications - No data to display  Initial Impression / Assessment and Plan / UC Course  I have reviewed the triage vital signs and the nursing notes.  Pertinent labs & imaging results that were available during my care of the patient were reviewed by me and considered in my medical decision making (see chart for details).    Initially prescribed Macrobid for UTI however patient reports that Cipro works better.  Will order Cipro and urine culture ordered as well.  Patient does have follow-up with her PCP regarding recurrent UTIs this upcoming week.  Encouraged sooner follow-up with any further concerns.  Final Clinical Impressions(s) / UC Diagnoses   Final diagnoses:  Acute cystitis without hematuria   Discharge Instructions   None    ED Prescriptions     Medication Sig Dispense Auth. Provider   nitrofurantoin, macrocrystal-monohydrate, (MACROBID) 100 MG  capsule Take 1 capsule (100 mg total) by mouth 2 (two) times daily. 10 capsule Tomi Bamberger, PA-C   ciprofloxacin (CIPRO) 500 MG tablet Take 1 tablet (500 mg total) by mouth every 12 (twelve) hours. 10 tablet Tomi Bamberger, PA-C  PDMP not reviewed this encounter.   Tomi Bamberger, PA-C 02/05/22 [phone]

## 2022-02-04 NOTE — ED Triage Notes (Signed)
Pt presents with ongoing burning during urination, bilateral flank pain, lower abdominal pain, and foul smelling urine X 4 days.

## 2022-02-05 ENCOUNTER — Encounter: Payer: Self-pay | Admitting: Physician Assistant

## 2022-02-06 ENCOUNTER — Ambulatory Visit: Payer: 59 | Admitting: Family Medicine

## 2022-02-06 ENCOUNTER — Other Ambulatory Visit (HOSPITAL_COMMUNITY)
Admission: RE | Admit: 2022-02-06 | Discharge: 2022-02-06 | Disposition: A | Discharge status: Home / Self Care | Payer: 59 | Source: Ambulatory Visit | Attending: Family Medicine | Admitting: Family Medicine

## 2022-02-06 ENCOUNTER — Encounter: Payer: Self-pay | Admitting: Family Medicine

## 2022-02-06 VITALS — BP 104/68 | HR 66 | Temp 97.8°F | Ht 62.0 in | Wt 138.0 lb

## 2022-02-06 DIAGNOSIS — Z114 Encounter for screening for human immunodeficiency virus [HIV]: Secondary | ICD-10-CM | POA: Diagnosis not present

## 2022-02-06 DIAGNOSIS — N390 Urinary tract infection, site not specified: Secondary | ICD-10-CM

## 2022-02-06 LAB — CERVICOVAGINAL ANCILLARY ONLY
Bacterial Vaginitis (gardnerella): NEGATIVE
Candida Glabrata: NEGATIVE
Candida Vaginitis: NEGATIVE
Comment: NEGATIVE
Comment: NEGATIVE
Comment: NEGATIVE

## 2022-02-06 NOTE — Progress Notes (Signed)
New Patient Office Visit  Subjective    Patient ID: Pamela Macias, female    DOB: [birth year]  Age: 41 y.o. MRN: [id number]  CC:  Chief Complaint  Patient presents with   Establish Care    NP/establish care, concerns about frequent UTI's would like referral to urologist. Patient not fasting     HPI Pamela Macias presents to establish care For evaluation of frequent UTIs.  She has had 3 this year.  Most recent one was this past week.  She was placed on ciprofloxacin and it seems to be helping.  Culture appears positive for probable E. coli.  Sensitivity has not returned.  Recent urine culture from this past September was positive for E. coli resistant to sulfur and amoxicillin.  She does not correlate her UTIs with sexual activity.  She started developing UTIs as a 41 year old.  They have been an ongoing problem for her.  Last Pap was 3 years ago.  Spanish interpreter was utilized for that visit.  Outpatient Encounter Medications as of 02/06/2022  Medication Sig   ciprofloxacin (CIPRO) 500 MG tablet Take 1 tablet (500 mg total) by mouth every 12 (twelve) hours.   omeprazole (PRILOSEC) 20 MG capsule Take 1 capsule (20 mg total) by mouth daily.   [DISCONTINUED] benzonatate (TESSALON) 100 MG capsule Take 1 capsule (100 mg total) by mouth every 8 (eight) hours. (Patient not taking: Reported on 09/22/2016)   [DISCONTINUED] butalbital-acetaminophen-caffeine (FIORICET, ESGIC) 50-325-40 MG tablet Take 1 tablet by mouth 2 (two) times daily as needed for headache.    [DISCONTINUED] dicyclomine (BENTYL) 20 MG tablet Take 1 tablet (20 mg total) by mouth every 12 (twelve) hours as needed (for abdominal cramping).   [DISCONTINUED] famotidine (PEPCID) 20 MG tablet Take 1 tablet (20 mg total) by mouth 2 (two) times daily for 7 days.   [DISCONTINUED] ibuprofen (ADVIL,MOTRIN) 800 MG tablet Take 1 tablet (800 mg total) by mouth 3 (three) times daily. (Patient not taking: Reported on 09/22/2016)    [DISCONTINUED] nitrofurantoin, macrocrystal-monohydrate, (MACROBID) 100 MG capsule Take 1 capsule (100 mg total) by mouth 2 (two) times daily.   [DISCONTINUED] ondansetron (ZOFRAN ODT) 4 MG disintegrating tablet Take 1 tablet (4 mg total) by mouth every 8 (eight) hours as needed for nausea or vomiting.   [DISCONTINUED] predniSONE (STERAPRED UNI-PAK 21 TAB) 10 MG (21) TBPK tablet Take 6 tabs day 1, 5 tabs day 2, 4 tabs day 3, 3 tabs day 4, 2 tabs day 5, and 1 tab on day 6. (Patient not taking: Reported on 09/22/2016)   [DISCONTINUED] sucralfate (CARAFATE) 1 GM/10ML suspension Take 10 mLs (1 g total) by mouth 4 (four) times daily -  with meals and at bedtime. (Patient not taking: Reported on 04/23/2018)   No facility-administered encounter medications on file as of 02/06/2022.    Past Medical History:  Diagnosis Date   Asthma    Gastritis    Migraine headache     Past Surgical History:  Procedure Laterality Date   TUBAL LIGATION      Family History  Problem Relation Age of Onset   Hypertension Mother     Social History   Socioeconomic History   Marital status: Married    Spouse name: Not on file   Number of children: Not on file   Years of education: Not on file   Highest education level: Not on file  Occupational History   Not on file  Tobacco Use   Smoking status: Never  Smokeless tobacco: Never  Vaping Use   Vaping Use: Never used  Substance and Sexual Activity   Alcohol use: No   Drug use: No   Sexual activity: Yes  Other Topics Concern   Not on file  Social History Narrative   Not on file   Social Determinants of Health   Financial Resource Strain: Not on file  Food Insecurity: Not on file  Transportation Needs: Not on file  Physical Activity: Not on file  Stress: Not on file  Social Connections: Not on file  Intimate Partner Violence: Not on file    Review of Systems  Constitutional:  Negative for chills, diaphoresis, malaise/fatigue and weight loss.   HENT: Negative.    Eyes: Negative.  Negative for blurred vision and double vision.  Cardiovascular:  Negative for chest pain.  Gastrointestinal:  Negative for abdominal pain.  Genitourinary: Negative.   Musculoskeletal:  Negative for falls and myalgias.  Neurological:  Negative for speech change, loss of consciousness and weakness.  Psychiatric/Behavioral: Negative.          Objective    BP 104/68 (BP Location: Left Arm, Patient Position: Sitting, Cuff Size: Normal)   Pulse 66   Temp 97.8 F (36.6 C) (Temporal)   Ht 5\' 2"  (1.575 m)   Wt 138 lb (62.6 kg)   LMP 01/23/2022   SpO2 98%   BMI 25.24 kg/m   Physical Exam Constitutional:      General: She is not in acute distress.    Appearance: Normal appearance. She is not ill-appearing, toxic-appearing or diaphoretic.  HENT:     Head: Normocephalic and atraumatic.     Right Ear: External ear normal.     Left Ear: External ear normal.     Mouth/Throat:     Mouth: Mucous membranes are moist.     Pharynx: Oropharynx is clear. No oropharyngeal exudate or posterior oropharyngeal erythema.  Eyes:     General: No scleral icterus.       Right eye: No discharge.        Left eye: No discharge.     Extraocular Movements: Extraocular movements intact.     Conjunctiva/sclera: Conjunctivae normal.  Cardiovascular:     Rate and Rhythm: Normal rate and regular rhythm.  Pulmonary:     Effort: Pulmonary effort is normal. No respiratory distress.     Breath sounds: Normal breath sounds.  Abdominal:     General: Bowel sounds are normal.     Tenderness: There is no right CVA tenderness or left CVA tenderness.  Musculoskeletal:     Cervical back: No rigidity or tenderness.  Skin:    General: Skin is warm and dry.  Neurological:     Mental Status: She is alert and oriented to person, place, and time.  Psychiatric:        Mood and Affect: Mood normal.        Behavior: Behavior normal.         Assessment & Plan:   Problem List  Items Addressed This Visit   None Visit Diagnoses     Frequent UTI    -  Primary   Relevant Orders   Urine cytology ancillary only   HIV antibody (with reflex)   Ambulatory referral to Urology   Screening for HIV (human immunodeficiency virus)       Relevant Orders   HIV antibody (with reflex)       Return Follow-up for health check and routine lab work.  Follow-up with  GYN provider for female check..  Information on UTIs was given.  Advised to complete course of Cipro.  Mliss Sax, MD

## 2022-02-07 LAB — URINE CULTURE: Culture: >=100000 — AB

## 2022-02-07 LAB — HIV ANTIBODY (ROUTINE TESTING W REFLEX): HIV 1&2 Ab, 4th Generation: NONREACTIVE

## 2022-02-08 LAB — URINE CYTOLOGY ANCILLARY ONLY
Bacterial Vaginitis-Urine: NEGATIVE
Candida Urine: NEGATIVE
Chlamydia: NEGATIVE
Comment: NEGATIVE
Comment: NEGATIVE
Comment: NORMAL
Neisseria Gonorrhea: NEGATIVE
Trichomonas: NEGATIVE

## 2022-02-20 ENCOUNTER — Encounter: Payer: Self-pay | Admitting: Family Medicine

## 2022-02-20 ENCOUNTER — Encounter: Payer: 59 | Admitting: Family Medicine

## 2022-02-20 ENCOUNTER — Telehealth: Payer: Self-pay | Admitting: Family Medicine

## 2022-02-20 ENCOUNTER — Ambulatory Visit (INDEPENDENT_AMBULATORY_CARE_PROVIDER_SITE_OTHER): Payer: 59 | Admitting: Family Medicine

## 2022-02-20 VITALS — BP 110/68 | HR 55 | Temp 97.2°F | Ht 62.0 in | Wt 137.8 lb

## 2022-02-20 DIAGNOSIS — Z0000 Encounter for general adult medical examination without abnormal findings: Secondary | ICD-10-CM | POA: Diagnosis not present

## 2022-02-20 DIAGNOSIS — Z13220 Encounter for screening for lipoid disorders: Secondary | ICD-10-CM | POA: Diagnosis not present

## 2022-02-20 DIAGNOSIS — D509 Iron deficiency anemia, unspecified: Secondary | ICD-10-CM | POA: Diagnosis not present

## 2022-02-20 LAB — LIPID PANEL
Cholesterol: 188 mg/dL (ref 0–200)
HDL: 55.2 mg/dL (ref >39.00)
LDL Cholesterol: 116 mg/dL — ABNORMAL HIGH (ref 0–99)
NonHDL: 132.35
Total CHOL/HDL Ratio: 3
Triglycerides: 80 mg/dL (ref 0.0–149.0)
VLDL: 16 mg/dL (ref 0.0–40.0)

## 2022-02-20 LAB — COMPREHENSIVE METABOLIC PANEL
ALT: 10 U/L (ref 0–35)
AST: 13 U/L (ref 0–37)
Albumin: 4.2 g/dL (ref 3.5–5.2)
Alkaline Phosphatase: 41 U/L (ref 39–117)
BUN: 9 mg/dL (ref 6–23)
CO2: 26 mEq/L (ref 19–32)
Calcium: 9.4 mg/dL (ref 8.4–10.5)
Chloride: 107 mEq/L (ref 96–112)
Creatinine, Ser: 0.52 mg/dL (ref 0.40–1.20)
GFR: 116.5 mL/min (ref >60.00)
Glucose, Bld: 84 mg/dL (ref 70–99)
Potassium: 4.9 mEq/L (ref 3.5–5.1)
Sodium: 140 mEq/L (ref 135–145)
Total Bilirubin: 0.7 mg/dL (ref 0.2–1.2)
Total Protein: 6.9 g/dL (ref 6.0–8.3)

## 2022-02-20 LAB — CBC
HCT: 30.5 % — ABNORMAL LOW (ref 36.0–46.0)
Hemoglobin: 9.5 g/dL — ABNORMAL LOW (ref 12.0–15.0)
MCHC: 31.2 g/dL (ref 30.0–36.0)
MCV: 78.2 fl (ref 78.0–100.0)
Platelets: 291 10*3/uL (ref 150.0–400.0)
RBC: 3.9 Mil/uL (ref 3.87–5.11)
RDW: 15.8 % — ABNORMAL HIGH (ref 11.5–15.5)
WBC: 4 10*3/uL (ref 4.0–10.5)

## 2022-02-20 NOTE — Addendum Note (Signed)
Addended by: Andrez Grime on: 02/20/2022 04:55 PM   Modules accepted: Orders

## 2022-02-20 NOTE — Progress Notes (Addendum)
Established Patient Office Visit   Subjective:  Patient ID: Pamela Macias, female    DOB: 12-07-1980  Age: 41 y.o. MRN: [id number]  Chief Complaint  Patient presents with   Annual Exam    CPE, no concerns. Patient fasting.     HPI Encounter Diagnoses  Name Primary?   Routine general medical examination at a health care facility Yes   Microcytic anemia    Fasting this morning for health check.  Upcoming appointment with GYN for well woman care.  Scheduled to see neurology frequent UTIs.  Status post gastric sleeve she has been able lose 80 pounds.  She has regular dental care.  Not exercising regularly other than work.  Feels well.   Review of Systems  Constitutional: Negative.   HENT: Negative.    Eyes:  Negative for blurred vision, discharge and redness.  Respiratory: Negative.    Cardiovascular: Negative.   Gastrointestinal:  Negative for abdominal pain.  Genitourinary: Negative.   Musculoskeletal: Negative.  Negative for myalgias.  Skin:  Negative for rash.  Neurological:  Negative for tingling, loss of consciousness and weakness.  Endo/Heme/Allergies:  Negative for polydipsia.     Current Outpatient Medications:    omeprazole (PRILOSEC) 20 MG capsule, Take 1 capsule (20 mg total) by mouth daily., Disp: 30 capsule, Rfl: 0   Objective:     BP 110/68 (BP Location: Left Arm, Patient Position: Sitting, Cuff Size: Normal)   Pulse (!) 55   Temp (!) 97.2 F (36.2 C) (Temporal)   Ht 5\' 2"  (1.575 m)   Wt 137 lb 12.8 oz (62.5 kg)   LMP 01/23/2022   SpO2 99%   BMI 25.20 kg/m    Physical Exam Constitutional:      General: She is not in acute distress.    Appearance: Normal appearance. She is not ill-appearing, toxic-appearing or diaphoretic.  HENT:     Head: Normocephalic and atraumatic.     Right Ear: External ear normal.     Left Ear: External ear normal.     Mouth/Throat:     Mouth: Mucous membranes are moist.     Pharynx: Oropharynx is clear. No  oropharyngeal exudate or posterior oropharyngeal erythema.  Eyes:     General: No scleral icterus.       Right eye: No discharge.        Left eye: No discharge.     Extraocular Movements: Extraocular movements intact.     Conjunctiva/sclera: Conjunctivae normal.     Pupils: Pupils are equal, round, and reactive to light.  Cardiovascular:     Rate and Rhythm: Normal rate and regular rhythm.  Pulmonary:     Effort: Pulmonary effort is normal. No respiratory distress.     Breath sounds: Normal breath sounds.  Abdominal:     General: Bowel sounds are normal.     Tenderness: There is no right CVA tenderness or left CVA tenderness.  Musculoskeletal:     Cervical back: No rigidity or tenderness.     Right lower leg: No edema.     Left lower leg: No edema.  Skin:    General: Skin is warm and dry.  Neurological:     Mental Status: She is alert and oriented to person, place, and time.  Psychiatric:        Mood and Affect: Mood normal.        Behavior: Behavior normal.      Results for orders placed or performed in visit on 02/20/22  CBC  Result Value Ref Range   WBC 4.0 4.0 - 10.5 K/uL   RBC 3.90 3.87 - 5.11 Mil/uL   Platelets 291.0 150.0 - 400.0 K/uL   Hemoglobin 9.5 (L) 12.0 - 15.0 g/dL   HCT 30.5 (L) 36.0 - 46.0 %   MCV 78.2 78.0 - 100.0 fl   MCHC 31.2 30.0 - 36.0 g/dL   RDW 15.8 (H) 11.5 - 15.5 %  Comprehensive metabolic panel  Result Value Ref Range   Sodium 140 135 - 145 mEq/L   Potassium 4.9 3.5 - 5.1 mEq/L   Chloride 107 96 - 112 mEq/L   CO2 26 19 - 32 mEq/L   Glucose, Bld 84 70 - 99 mg/dL   BUN 9 6 - 23 mg/dL   Creatinine, Ser 0.52 0.40 - 1.20 mg/dL   Total Bilirubin 0.7 0.2 - 1.2 mg/dL   Alkaline Phosphatase 41 39 - 117 U/L   AST 13 0 - 37 U/L   ALT 10 0 - 35 U/L   Total Protein 6.9 6.0 - 8.3 g/dL   Albumin 4.2 3.5 - 5.2 g/dL   GFR 116.50 >60.00 mL/min   Calcium 9.4 8.4 - 10.5 mg/dL  Lipid panel  Result Value Ref Range   Cholesterol 188 0 - 200 mg/dL    Triglycerides 80.0 0.0 - 149.0 mg/dL   HDL 55.20 >39.00 mg/dL   VLDL 16.0 0.0 - 40.0 mg/dL   LDL Cholesterol 116 (H) 0 - 99 mg/dL   Total CHOL/HDL Ratio 3    NonHDL 132.35       The 10-year ASCVD risk score (Arnett DK, et al., 2019) is: 0.4%    Assessment & Plan:   Routine general medical examination at a health care facility -     CBC -     Comprehensive metabolic panel -     Lipid panel  Microcytic anemia -     Iron, TIBC and Ferritin Panel    No follow-ups on file.  Encouraged regular exercise for 30 minutes.  Walking works well.  Information given on health maintenance and disease prevention.  Libby Maw, MD

## 2022-02-21 NOTE — Telephone Encounter (Signed)
error 

## 2022-02-22 NOTE — Telephone Encounter (Signed)
error 

## 2022-02-23 ENCOUNTER — Telehealth: Payer: Self-pay | Admitting: Family Medicine

## 2022-02-23 NOTE — Telephone Encounter (Signed)
Appointment scheduled to discuss labs  

## 2022-02-23 NOTE — Telephone Encounter (Signed)
Pt has questions concerning her most recent lab result for Iron. Please advise pt at [phone]

## 2022-02-24 ENCOUNTER — Encounter: Payer: Self-pay | Admitting: Family Medicine

## 2022-02-24 ENCOUNTER — Telehealth (INDEPENDENT_AMBULATORY_CARE_PROVIDER_SITE_OTHER): Payer: 59 | Admitting: Family Medicine

## 2022-02-24 VITALS — Ht 62.0 in

## 2022-02-24 DIAGNOSIS — D509 Iron deficiency anemia, unspecified: Secondary | ICD-10-CM | POA: Diagnosis not present

## 2022-02-24 MED ORDER — IRON (FERROUS SULFATE) 325 (65 FE) MG PO TABS: ORAL_TABLET | ORAL | 1 refills | Status: DC

## 2022-02-24 NOTE — Progress Notes (Signed)
Established Patient Office Visit   Subjective:  Patient ID: Pamela Macias, female    DOB: Jun 14, 1980  Age: 41 y.o. MRN: [id number]  Chief Complaint  Patient presents with   Advice Only    Discuss labs     HPI Encounter Diagnoses  Name Primary?   Microcytic anemia Yes   For follow-up of microcytic anemia.  Hemoglobin has been stable and normal for 5 years prior to blood work obtained at her last visit.  She continues administrating regularly approximately every 28 days.  Menses usually last around 5 days.  Her flow is heaviest in the beginning and then tapers off.  For the first 2 to 3 days she may use up to 8 pads daily.  However this pattern is her usual pattern and has not really changed.  She does not feel as though she is losing any more black than usual.  She denies seeing any blood in her urine or stool.  Stools have not been black or tarry.  She feels well otherwise.   Review of Systems  Constitutional: Negative.   HENT: Negative.    Eyes:  Negative for blurred vision, discharge and redness.  Respiratory: Negative.    Cardiovascular: Negative.   Gastrointestinal:  Negative for abdominal pain, blood in stool, constipation, diarrhea and melena.  Genitourinary: Negative.  Negative for dysuria, frequency, hematuria and urgency.  Musculoskeletal: Negative.  Negative for myalgias.  Skin:  Negative for rash.  Neurological:  Negative for tingling, loss of consciousness and weakness.  Endo/Heme/Allergies:  Negative for polydipsia.     Current Outpatient Medications:    omeprazole (PRILOSEC) 20 MG capsule, Take 1 capsule (20 mg total) by mouth daily., Disp: 30 capsule, Rfl: 0   Iron, Ferrous Sulfate, 325 (65 Fe) MG TABS, Take 325 mg by mouth daily for 7 days, THEN 325 mg 2 (two) times daily before a meal., Disp: 180 tablet, Rfl: 1   Objective:     Ht 5\' 2"  (1.575 m)   LMP 01/23/2022   BMI 25.20 kg/m    Physical Exam Constitutional:      General: She is not in  acute distress.    Appearance: Normal appearance. She is not ill-appearing, toxic-appearing or diaphoretic.  HENT:     Head: Normocephalic and atraumatic.     Right Ear: External ear normal.     Left Ear: External ear normal.  Eyes:     General: No scleral icterus.       Right eye: No discharge.        Left eye: No discharge.     Extraocular Movements: Extraocular movements intact.     Conjunctiva/sclera: Conjunctivae normal.  Pulmonary:     Effort: Pulmonary effort is normal. No respiratory distress.  Skin:    General: Skin is warm and dry.  Neurological:     Mental Status: She is alert and oriented to person, place, and time.  Psychiatric:        Mood and Affect: Mood normal.        Behavior: Behavior normal.      No results found for any visits on 02/24/22.    The 10-year ASCVD risk score (Arnett DK, et al., 2019) is: 0.4%    Assessment & Plan:   Microcytic anemia -     CBC with Differential/Platelet; Future -     Iron, TIBC and Ferritin Panel; Future -     B12 and Folate Panel; Future -     Iron (Ferrous  Sulfate); Take 325 mg by mouth daily for 7 days, THEN 325 mg 2 (two) times daily before a meal.  Dispense: 180 tablet; Refill: 1    Return in about 3 months (around 05/26/2022).  Patient will follow-up with her GYN doctor as well.  She will return for above ordered blood work.  Mliss Sax, MD  Virtual Visit via Video Note  I connected with Pamela Macias on 02/24/22 at  1:20 PM EST by a video enabled telemedicine application and verified that I am speaking with the correct person using two identifiers.  Location: Patient: alone in her car.  Provider: work   I discussed the limitations of evaluation and management by telemedicine and the availability of in person appointments. The patient expressed understanding and agreed to proceed.  History of Present Illness:    Observations/Objective:   Assessment and Plan:   Follow Up  Instructions:    I discussed the assessment and treatment plan with the patient. The patient was provided an opportunity to ask questions and all were answered. The patient agreed with the plan and demonstrated an understanding of the instructions.   The patient was advised to call back or seek an in-person evaluation if the symptoms worsen or if the condition fails to improve as anticipated.  I provided 20 minutes of non-face-to-face time during this encounter.   Mliss Sax, MD

## 2022-03-20 ENCOUNTER — Ambulatory Visit: Payer: Medicaid Other | Admitting: Urology

## 2023-02-26 ENCOUNTER — Telehealth: Payer: Self-pay

## 2023-02-26 NOTE — Transitions of Care (Post Inpatient/ED Visit) (Signed)
   02/26/2023  Name: Pamela Macias MRN: [id number] DOB: [birth year]  Today's TOC FU Call Status: Today's TOC FU Call Status:: Successful TOC FU Call Completed TOC FU Call Complete Date: 02/26/23 Patient's Name and Date of Birth confirmed.  Transition Care Management Follow-up Telephone Call Date of Discharge: 02/23/23 Discharge Facility: Other [facility]) Name of Other (Non-[facility]) Discharge Facility: [facility] Type of Discharge: Emergency Department Reason for ED Visit: Other: (Abnormal uterine and vaginal bleeding, unspecified) How have you been since you were released from the hospital?: Better Any questions or concerns?: No  Items Reviewed: Did you receive and understand the discharge instructions provided?: Yes Any new allergies since your discharge?: No Dietary orders reviewed?: NA Do you have support at home?: Yes  Medications Reviewed Today: Medications Reviewed Today   Medications were not reviewed in this encounter     Home Care and Equipment/Supplies: Were Home Health Services Ordered?: NA Any new equipment or medical supplies ordered?: NA  Functional Questionnaire: Do you need assistance with bathing/showering or dressing?: No Do you need assistance with meal preparation?: No Do you need assistance with eating?: No Do you have difficulty maintaining continence: No Do you need assistance with getting out of bed/getting out of a chair/moving?: No Do you have difficulty managing or taking your medications?: No  Follow up appointments reviewed: PCP Follow-up appointment confirmed?: No Specialist Hospital Follow-up appointment confirmed?: Yes Date of Specialist follow-up appointment?: 03/01/23 Follow-Up Specialty Provider:: OB Do you need transportation to your follow-up appointment?: No Do you understand care options if your condition(s) worsen?: Yes-patient verbalized understanding    SIGNATURE  Arvil Persons, BSN, RN

## 2023-11-19 NOTE — Progress Notes (Unsigned)
 [facility] [phone]   [phone]  INITIAL CONSULT NOTE  Patient Care Team: Berneta Elsie Sayre, MD as PCP - General (Family Medicine)  Hematological/Oncological History 07/27/2023: Hgb 10.1 (L), MCV 85, Plt 275, Iron  27, iron  saturation 6% (L), ferritin 8 (L), TIBC 349.  11/20/2023: Establish care with [facility] Hematology  CHIEF COMPLAINTS/PURPOSE OF CONSULTATION:  Iron  deficiency anemia   HISTORY OF PRESENTING ILLNESS:  Pamela Macias 43 y.o. female with medical history significant for ***  On review of the previous records ***  On exam today ***  MEDICAL HISTORY:  Past Medical History:  Diagnosis Date   Asthma    Gastritis    Migraine headache     SURGICAL HISTORY: Past Surgical History:  Procedure Laterality Date   TUBAL LIGATION      SOCIAL HISTORY: Social History   Socioeconomic History   Marital status: Married    Spouse name: Not on file   Number of children: Not on file   Years of education: Not on file   Highest education level: Not on file  Occupational History   Not on file  Tobacco Use   Smoking status: Never   Smokeless tobacco: Never  Vaping Use   Vaping status: Never Used  Substance and Sexual Activity   Alcohol use: No   Drug use: No   Sexual activity: Yes  Other Topics Concern   Not on file  Social History Narrative   Not on file   Social Drivers of Health   Financial Resource Strain: Not on file  Food Insecurity: Medium Risk (03/28/2023)   Received from [facility]   Hunger Vital Sign    Within the past 12 months, you worried that your food would run out before you got money to buy more: Sometimes true    Within the past 12 months, the food you bought just didn't last and you didn't have money to get more. : Sometimes true  Transportation Needs: No Transportation Needs (03/28/2023)   Received from [facility]    In the past 12 months, has lack of reliable  transportation kept you from medical appointments, meetings, work or from getting things needed for daily living? : No  Physical Activity: Not on file  Stress: Not on file  Social Connections: Unknown (07/25/2021)   Received from [facility]   Social Network    Social Network: Not on file  Intimate Partner Violence: Unknown (06/16/2021)   Received from [facility]   HITS    Physically Hurt: Not on file    Insult or Talk Down To: Not on file    Threaten Physical Harm: Not on file    Scream or Curse: Not on file    FAMILY HISTORY: Family History  Problem Relation Age of Onset   Hypertension Mother     ALLERGIES:  is allergic to ancef [cefazolin].  MEDICATIONS:  Current Outpatient Medications  Medication Sig Dispense Refill   Cobalamin Combinations (B-12) [phone] MCG SUBL Take 1 tablet by mouth daily.     Iron , Ferrous Sulfate , 325 (65 Fe) MG TABS Take 325 mg by mouth daily for 7 days, THEN 325 mg 2 (two) times daily before a meal. 180 tablet 1   omeprazole  (PRILOSEC) 20 MG capsule Take 1 capsule (20 mg total) by mouth daily. 30 capsule 0   No current facility-administered medications for this visit.    REVIEW OF SYSTEMS:   Constitutional: ( - ) fevers, ( - )  chills , ( - ) night sweats Eyes: ( - ) blurriness of vision, ( - ) double vision, ( - ) watery eyes Ears, nose, mouth, throat, and face: ( - ) mucositis, ( - ) sore throat Respiratory: ( - ) cough, ( - ) dyspnea, ( - ) wheezes Cardiovascular: ( - ) palpitation, ( - ) chest discomfort, ( - ) lower extremity swelling Gastrointestinal:  ( - ) nausea, ( - ) heartburn, ( - ) change in bowel habits Skin: ( - ) abnormal skin rashes Lymphatics: ( - ) new lymphadenopathy, ( - ) easy bruising Neurological: ( - ) numbness, ( - ) tingling, ( - ) new weaknesses Behavioral/Psych: ( - ) mood change, ( - ) new changes  All other systems were reviewed with the patient and are negative.  PHYSICAL EXAMINATION: ECOG PERFORMANCE  STATUS: {CHL ONC ECOG PS:[id number]}  There were no vitals filed for this visit. There were no vitals filed for this visit.  GENERAL: well appearing *** in NAD  SKIN: skin color, texture, turgor are normal, no rashes or significant lesions EYES: conjunctiva are pink and non-injected, sclera clear OROPHARYNX: no exudate, no erythema; lips, buccal mucosa, and tongue normal  NECK: supple, non-tender LYMPH:  no palpable lymphadenopathy in the cervical, axillary or supraclavicular lymph nodes.  LUNGS: clear to auscultation and percussion with normal breathing effort HEART: regular rate & rhythm and no murmurs and no lower extremity edema ABDOMEN: soft, non-tender, non-distended, normal bowel sounds Musculoskeletal: no cyanosis of digits and no clubbing  PSYCH: alert & oriented x 3, fluent speech NEURO: no focal motor/sensory deficits  LABORATORY DATA:  I have reviewed the data as listed    Latest Ref Rng & Units 02/20/2022    8:36 AM 09/18/2020    9:19 PM 04/23/2018    3:58 PM  CBC  WBC 4.0 - 10.5 K/uL 4.0  7.2  8.0   Hemoglobin 12.0 - 15.0 g/dL 9.5  87.1  86.4   Hematocrit 36.0 - 46.0 % 30.5  39.9  42.1   Platelets 150.0 - 400.0 K/uL 291.0  248  227        Latest Ref Rng & Units 02/20/2022    8:36 AM 09/18/2020    9:19 PM 04/23/2018    3:58 PM  CMP  Glucose 70 - 99 mg/dL 84  860  890   BUN 6 - 23 mg/dL 9  14  11    Creatinine 0.40 - 1.20 mg/dL 9.47  8.53  9.36   Sodium 135 - 145 mEq/L 140  139  136   Potassium 3.5 - 5.1 mEq/L 4.9  3.9  3.5   Chloride 96 - 112 mEq/L 107  106  105   CO2 19 - 32 mEq/L 26  26  23    Calcium 8.4 - 10.5 mg/dL 9.4  9.0  8.9   Total Protein 6.0 - 8.3 g/dL 6.9  7.4  7.5   Total Bilirubin 0.2 - 1.2 mg/dL 0.7  0.4  0.5   Alkaline Phos 39 - 117 U/L 41  52  46   AST 0 - 37 U/L 13  24  28    ALT 0 - 35 U/L 10  31  35      PATHOLOGY: ***  BLOOD FILM: *** Review of the peripheral blood smear showed normal appearing white cells with neutrophils that were  appropriately lobated and granulated. There was no predominance of bi-lobed or hyper-segmented neutrophils appreciated. No Dohle bodies were noted. There  was no left shifting, immature forms or blasts noted. Lymphocytes remain normal in size without any predominance of large granular lymphocytes. Red cells show no anisopoikilocytosis, macrocytes , microcytes or polychromasia. There were no schistocytes, target cells, echinocytes, acanthocytes, dacrocytes, or stomatocytes.There was no rouleaux formation, nucleated red cells, or intra-cellular inclusions noted. The platelets are normal in size, shape, and color without any clumping evident.  RADIOGRAPHIC STUDIES: I have personally reviewed the radiological images as listed and agreed with the findings in the report. No results found.  ASSESSMENT & PLAN ***  No orders of the defined types were placed in this encounter.   All questions were answered. The patient knows to call the clinic with any problems, questions or concerns.  I have spent a total of {CHL ONC TIME VISIT - DTPQU:[id number]} minutes of face-to-face and non-face-to-face time, preparing to see the patient, obtaining and/or reviewing separately obtained history, performing a medically appropriate examination, counseling and educating the patient, ordering medications/tests/procedures, referring and communicating with other health care professionals, documenting clinical information in the electronic health record, independently interpreting results and communicating results to the patient, and care coordination.   Johnston Police, PA-C Department of Hematology/Oncology [facility] Phone: [phone]

## 2023-11-20 ENCOUNTER — Ambulatory Visit: Payer: Self-pay | Admitting: Physician Assistant

## 2023-11-20 ENCOUNTER — Encounter: Payer: Self-pay | Admitting: Physician Assistant

## 2023-11-20 ENCOUNTER — Inpatient Hospital Stay: Attending: Physician Assistant | Admitting: Physician Assistant

## 2023-11-20 ENCOUNTER — Inpatient Hospital Stay

## 2023-11-20 ENCOUNTER — Other Ambulatory Visit: Payer: Self-pay | Admitting: Physician Assistant

## 2023-11-20 VITALS — BP 112/83 | HR 76 | Temp 97.5°F | Resp 17 | Ht 62.0 in | Wt 152.0 lb

## 2023-11-20 DIAGNOSIS — Z79899 Other long term (current) drug therapy: Secondary | ICD-10-CM | POA: Diagnosis not present

## 2023-11-20 DIAGNOSIS — Z9884 Bariatric surgery status: Secondary | ICD-10-CM | POA: Insufficient documentation

## 2023-11-20 DIAGNOSIS — E611 Iron deficiency: Secondary | ICD-10-CM | POA: Insufficient documentation

## 2023-11-20 DIAGNOSIS — D509 Iron deficiency anemia, unspecified: Secondary | ICD-10-CM | POA: Diagnosis not present

## 2023-11-20 DIAGNOSIS — K909 Intestinal malabsorption, unspecified: Secondary | ICD-10-CM | POA: Diagnosis not present

## 2023-11-20 DIAGNOSIS — E538 Deficiency of other specified B group vitamins: Secondary | ICD-10-CM | POA: Diagnosis not present

## 2023-11-20 DIAGNOSIS — D508 Other iron deficiency anemias: Secondary | ICD-10-CM

## 2023-11-20 LAB — CMP (CANCER CENTER ONLY)
ALT: 13 U/L (ref 0–44)
AST: 16 U/L (ref 15–41)
Albumin: 4.6 g/dL (ref 3.5–5.0)
Alkaline Phosphatase: 52 U/L (ref 38–126)
Anion gap: 6 (ref 5–15)
BUN: 12 mg/dL (ref 6–20)
CO2: 28 mmol/L (ref 22–32)
Calcium: 9.3 mg/dL (ref 8.9–10.3)
Chloride: 105 mmol/L (ref 98–111)
Creatinine: 0.67 mg/dL (ref 0.44–1.00)
GFR, Estimated: >60 mL/min (ref >60)
Glucose, Bld: 87 mg/dL (ref 70–99)
Potassium: 3.9 mmol/L (ref 3.5–5.1)
Sodium: 139 mmol/L (ref 135–145)
Total Bilirubin: 0.5 mg/dL (ref 0.0–1.2)
Total Protein: 7.6 g/dL (ref 6.5–8.1)

## 2023-11-20 LAB — CBC WITH DIFFERENTIAL (CANCER CENTER ONLY)
Abs Immature Granulocytes: 0 K/uL (ref 0.00–0.07)
Basophils Absolute: 0 K/uL (ref 0.0–0.1)
Basophils Relative: 0 %
Eosinophils Absolute: 0.1 K/uL (ref 0.0–0.5)
Eosinophils Relative: 1 %
HCT: 38.8 % (ref 36.0–46.0)
Hemoglobin: 12.9 g/dL (ref 12.0–15.0)
Immature Granulocytes: 0 %
Lymphocytes Relative: 23 %
Lymphs Abs: 1.3 K/uL (ref 0.7–4.0)
MCH: 29.9 pg (ref 26.0–34.0)
MCHC: 33.2 g/dL (ref 30.0–36.0)
MCV: 90 fL (ref 80.0–100.0)
Monocytes Absolute: 0.3 K/uL (ref 0.1–1.0)
Monocytes Relative: 6 %
Neutro Abs: 4 K/uL (ref 1.7–7.7)
Neutrophils Relative %: 70 %
Platelet Count: 247 K/uL (ref 150–400)
RBC: 4.31 MIL/uL (ref 3.87–5.11)
RDW: 14.2 % (ref 11.5–15.5)
WBC Count: 5.7 K/uL (ref 4.0–10.5)
nRBC: 0 % (ref 0.0–0.2)

## 2023-11-20 LAB — IRON AND IRON BINDING CAPACITY (CC-WL,HP ONLY)
Iron: 98 ug/dL (ref 28–170)
Saturation Ratios: 22 % (ref 10.4–31.8)
TIBC: 454 ug/dL — ABNORMAL HIGH (ref 250–450)
UIBC: 356 ug/dL (ref 148–442)

## 2023-11-20 LAB — VITAMIN B12: Vitamin B-12: 763 pg/mL (ref 180–914)

## 2023-11-20 LAB — FERRITIN: Ferritin: 14 ng/mL (ref 11–307)

## 2023-11-21 ENCOUNTER — Telehealth: Payer: Self-pay

## 2023-11-21 NOTE — Telephone Encounter (Signed)
 Johnston, patient will be scheduled as soon as possible.  Auth Submission: NO AUTH NEEDED Site of care: Site of care: [facility] Payer: [employer]BS commercial Medication & CPT/J Code(s) submitted: Feraheme (ferumoxytol) [id number] Diagnosis Code:  Route of submission (phone, fax, portal): phone Phone # [phone] Fax # Auth type: Buy/Bill PB Units/visits requested: 510mg  x 2 doses Reference number: [id number] Approval from: 11/21/23 to 02/20/24

## 2023-11-27 ENCOUNTER — Ambulatory Visit

## 2023-11-27 VITALS — BP 114/79 | HR 79 | Temp 98.4°F | Resp 16 | Ht 62.0 in | Wt 154.6 lb

## 2023-11-27 DIAGNOSIS — Z9884 Bariatric surgery status: Secondary | ICD-10-CM | POA: Diagnosis not present

## 2023-11-27 DIAGNOSIS — K909 Intestinal malabsorption, unspecified: Secondary | ICD-10-CM

## 2023-11-27 DIAGNOSIS — D509 Iron deficiency anemia, unspecified: Secondary | ICD-10-CM

## 2023-11-27 DIAGNOSIS — E611 Iron deficiency: Secondary | ICD-10-CM

## 2023-11-27 MED ORDER — SODIUM CHLORIDE 0.9 % IV SOLN
510.0000 mg | Freq: Once | INTRAVENOUS | Status: AC
  Administered 2023-11-27: 510 mg via INTRAVENOUS
  Filled 2023-11-27: qty 17

## 2023-11-27 NOTE — Progress Notes (Signed)
 Diagnosis: Iron  Deficiency Anemia  Provider:  Praveen Mannam MD  Procedure: IV Infusion  IV Type: Peripheral, IV Location: L Antecubital  Feraheme (Ferumoxytol ), Dose: 510 mg  Infusion Start Time: 1533  Infusion Stop Time: 1550  Post Infusion IV Care: Observation period completed and Peripheral IV Discontinued  Discharge: Condition: Good, Destination: Home . AVS Declined  Performed by:  Maximiano JONELLE Pouch, LPN

## 2023-12-04 ENCOUNTER — Ambulatory Visit (INDEPENDENT_AMBULATORY_CARE_PROVIDER_SITE_OTHER)

## 2023-12-04 VITALS — BP 100/63 | HR 74 | Temp 99.1°F | Resp 16 | Ht 62.0 in | Wt 152.2 lb

## 2023-12-04 DIAGNOSIS — E611 Iron deficiency: Secondary | ICD-10-CM

## 2023-12-04 DIAGNOSIS — K909 Intestinal malabsorption, unspecified: Secondary | ICD-10-CM

## 2023-12-04 DIAGNOSIS — Z9884 Bariatric surgery status: Secondary | ICD-10-CM

## 2023-12-04 DIAGNOSIS — D509 Iron deficiency anemia, unspecified: Secondary | ICD-10-CM | POA: Diagnosis not present

## 2023-12-04 MED ORDER — SODIUM CHLORIDE 0.9 % IV SOLN
510.0000 mg | Freq: Once | INTRAVENOUS | Status: AC
  Administered 2023-12-04: 510 mg via INTRAVENOUS
  Filled 2023-12-04: qty 17

## 2023-12-04 NOTE — Progress Notes (Cosign Needed Addendum)
 Diagnosis: Iron  Deficiency Anemia  Provider:  Praveen Mannam MD  Procedure: IV Infusion  IV Type: Peripheral, IV Location: L Antecubital  Feraheme (Ferumoxytol ), Dose: 510 mg  Infusion Start Time: 1449  Infusion Stop Time: 1509  Post Infusion IV Care: Observation period completed and Peripheral IV Discontinued  Discharge: Condition: Good, Destination: Home . AVS Declined  Performed by:  Arne Schlender G [place], RN

## 2024-02-12 ENCOUNTER — Inpatient Hospital Stay: Attending: Physician Assistant

## 2024-02-12 ENCOUNTER — Other Ambulatory Visit: Payer: Self-pay | Admitting: Hematology and Oncology

## 2024-02-12 DIAGNOSIS — K909 Intestinal malabsorption, unspecified: Secondary | ICD-10-CM | POA: Diagnosis present

## 2024-02-12 DIAGNOSIS — Z9884 Bariatric surgery status: Secondary | ICD-10-CM | POA: Diagnosis not present

## 2024-02-12 DIAGNOSIS — E611 Iron deficiency: Secondary | ICD-10-CM

## 2024-02-12 DIAGNOSIS — D500 Iron deficiency anemia secondary to blood loss (chronic): Secondary | ICD-10-CM | POA: Diagnosis present

## 2024-02-12 LAB — IRON AND IRON BINDING CAPACITY (CC-WL,HP ONLY)
Iron: 140 ug/dL (ref 28–170)
Saturation Ratios: 41 % — ABNORMAL HIGH (ref 10.4–31.8)
TIBC: 339 ug/dL (ref 250–450)
UIBC: 199 ug/dL

## 2024-02-12 LAB — CBC WITH DIFFERENTIAL (CANCER CENTER ONLY)
Abs Immature Granulocytes: 0.01 K/uL (ref 0.00–0.07)
Basophils Absolute: 0 K/uL (ref 0.0–0.1)
Basophils Relative: 0 %
Eosinophils Absolute: 0.1 K/uL (ref 0.0–0.5)
Eosinophils Relative: 1 %
HCT: 40.2 % (ref 36.0–46.0)
Hemoglobin: 13.4 g/dL (ref 12.0–15.0)
Immature Granulocytes: 0 %
Lymphocytes Relative: 25 %
Lymphs Abs: 1.2 K/uL (ref 0.7–4.0)
MCH: 32.2 pg (ref 26.0–34.0)
MCHC: 33.3 g/dL (ref 30.0–36.0)
MCV: 96.6 fL (ref 80.0–100.0)
Monocytes Absolute: 0.3 K/uL (ref 0.1–1.0)
Monocytes Relative: 5 %
Neutro Abs: 3.3 K/uL (ref 1.7–7.7)
Neutrophils Relative %: 69 %
Platelet Count: 211 K/uL (ref 150–400)
RBC: 4.16 MIL/uL (ref 3.87–5.11)
RDW: 12.9 % (ref 11.5–15.5)
WBC Count: 4.7 K/uL (ref 4.0–10.5)
nRBC: 0 % (ref 0.0–0.2)

## 2024-02-12 LAB — CMP (CANCER CENTER ONLY)
ALT: 17 U/L (ref 0–44)
AST: 20 U/L (ref 15–41)
Albumin: 4.6 g/dL (ref 3.5–5.0)
Alkaline Phosphatase: 56 U/L (ref 38–126)
Anion gap: 11 (ref 5–15)
BUN: 10 mg/dL (ref 6–20)
CO2: 26 mmol/L (ref 22–32)
Calcium: 9.2 mg/dL (ref 8.9–10.3)
Chloride: 103 mmol/L (ref 98–111)
Creatinine: 0.59 mg/dL (ref 0.44–1.00)
GFR, Estimated: >60 mL/min (ref >60)
Glucose, Bld: 89 mg/dL (ref 70–99)
Potassium: 4.3 mmol/L (ref 3.5–5.1)
Sodium: 139 mmol/L (ref 135–145)
Total Bilirubin: 0.5 mg/dL (ref 0.0–1.2)
Total Protein: 7.5 g/dL (ref 6.5–8.1)

## 2024-02-12 LAB — RETIC PANEL
Immature Retic Fract: 9.5 % (ref 2.3–15.9)
RBC.: 4.17 MIL/uL (ref 3.87–5.11)
Retic Count, Absolute: 114.7 K/uL (ref 19.0–186.0)
Retic Ct Pct: 2.8 % (ref 0.4–3.1)
Reticulocyte Hemoglobin: 35.9 pg (ref >27.9)

## 2024-02-12 LAB — FERRITIN: Ferritin: 274 ng/mL (ref 11–307)

## 2024-05-13 ENCOUNTER — Inpatient Hospital Stay: Attending: Physician Assistant

## 2024-05-13 ENCOUNTER — Inpatient Hospital Stay: Admitting: Physician Assistant
